# Patient Record
Sex: Male | Born: 1955
Health system: Southern US, Community
[De-identification: ages and names within clinical notes are randomized; demographics above are authoritative.]

## PROBLEM LIST (undated history)

## (undated) DIAGNOSIS — J449 Chronic obstructive pulmonary disease, unspecified: Secondary | ICD-10-CM

## (undated) DIAGNOSIS — I10 Essential (primary) hypertension: Secondary | ICD-10-CM

## (undated) DIAGNOSIS — I700 Atherosclerosis of aorta: Secondary | ICD-10-CM

## (undated) DIAGNOSIS — R0600 Dyspnea, unspecified: Secondary | ICD-10-CM

## (undated) HISTORY — PX: APPENDECTOMY: SHX54

---

## 2019-11-24 ENCOUNTER — Ambulatory Visit (INDEPENDENT_AMBULATORY_CARE_PROVIDER_SITE_OTHER): Payer: 59

## 2019-11-24 ENCOUNTER — Other Ambulatory Visit: Payer: Self-pay

## 2019-11-24 ENCOUNTER — Ambulatory Visit (INDEPENDENT_AMBULATORY_CARE_PROVIDER_SITE_OTHER): Payer: Self-pay

## 2019-11-24 ENCOUNTER — Ambulatory Visit
Admission: EM | Admit: 2019-11-24 | Discharge: 2019-11-24 | Disposition: A | Discharge status: Home / Self Care | Payer: 59 | Attending: Internal Medicine | Admitting: Internal Medicine

## 2019-11-24 DIAGNOSIS — J441 Chronic obstructive pulmonary disease with (acute) exacerbation: Secondary | ICD-10-CM

## 2019-11-24 DIAGNOSIS — C3490 Malignant neoplasm of unspecified part of unspecified bronchus or lung: Secondary | ICD-10-CM

## 2019-11-24 HISTORY — DX: Essential (primary) hypertension: I10

## 2019-11-24 LAB — BASIC METABOLIC PANEL
Anion gap: 9 (ref 5–15)
BUN: 21 mg/dL (ref 8–23)
CO2: 29 mmol/L (ref 22–32)
Calcium: 9.1 mg/dL (ref 8.9–10.3)
Chloride: 98 mmol/L (ref 98–111)
Creatinine, Ser: 0.9 mg/dL (ref 0.61–1.24)
GFR calc Af Amer: >60 mL/min (ref >60)
GFR calc non Af Amer: >60 mL/min (ref >60)
Glucose, Bld: 119 mg/dL — ABNORMAL HIGH (ref 70–99)
Potassium: 4.5 mmol/L (ref 3.5–5.1)
Sodium: 136 mmol/L (ref 135–145)

## 2019-11-24 LAB — CBC WITH DIFFERENTIAL/PLATELET
Abs Immature Granulocytes: 0.04 10*3/uL (ref 0.00–0.07)
Basophils Absolute: 0.1 10*3/uL (ref 0.0–0.1)
Basophils Relative: 1 %
Eosinophils Absolute: 0 10*3/uL (ref 0.0–0.5)
Eosinophils Relative: 0 %
HCT: 49.3 % (ref 39.0–52.0)
Hemoglobin: 16.2 g/dL (ref 13.0–17.0)
Immature Granulocytes: 1 %
Lymphocytes Relative: 10 %
Lymphs Abs: 0.8 10*3/uL (ref 0.7–4.0)
MCH: 29.1 pg (ref 26.0–34.0)
MCHC: 32.9 g/dL (ref 30.0–36.0)
MCV: 88.5 fL (ref 80.0–100.0)
Monocytes Absolute: 0.6 10*3/uL (ref 0.1–1.0)
Monocytes Relative: 7 %
Neutro Abs: 7.1 10*3/uL (ref 1.7–7.7)
Neutrophils Relative %: 81 %
Platelets: 283 10*3/uL (ref 150–400)
RBC: 5.57 MIL/uL (ref 4.22–5.81)
RDW: 13.6 % (ref 11.5–15.5)
WBC: 8.6 10*3/uL (ref 4.0–10.5)
nRBC: 0 % (ref 0.0–0.2)

## 2019-11-24 MED ORDER — GUAIFENESIN ER 600 MG PO TB12: 600.0000 mg | ORAL_TABLET | Freq: Two times a day (BID) | ORAL | 0 refills | Status: AC

## 2019-11-24 MED ORDER — PREDNISONE 20 MG PO TABS
40.0000 mg | ORAL_TABLET | Freq: Every day | ORAL | 0 refills | Status: AC
Start: 2019-11-24 — End: 2019-11-29

## 2019-11-24 MED ORDER — ALBUTEROL SULFATE HFA 108 (90 BASE) MCG/ACT IN AERS: 2.0000 | INHALATION_SPRAY | RESPIRATORY_TRACT | 0 refills | Status: AC | PRN

## 2019-11-24 MED ORDER — METHYLPREDNISOLONE SODIUM SUCC 125 MG IJ SOLR
60.0000 mg | Freq: Once | INTRAMUSCULAR | Status: AC
  Administered 2019-11-24: 60 mg via INTRAMUSCULAR

## 2019-11-24 MED ORDER — BENZONATATE 100 MG PO CAPS: 100.0000 mg | ORAL_CAPSULE | Freq: Three times a day (TID) | ORAL | 0 refills | Status: AC

## 2019-11-24 MED ORDER — AMLODIPINE BESYLATE 10 MG PO TABS: 10.0000 mg | ORAL_TABLET | Freq: Every day | ORAL | 1 refills | Status: AC

## 2019-11-24 MED ORDER — TRELEGY ELLIPTA 200-62.5-25 MCG/INH IN AEPB: 1.0000 | INHALATION_SPRAY | Freq: Every day | RESPIRATORY_TRACT | 1 refills | Status: AC

## 2019-11-24 NOTE — Discharge Instructions (Addendum)
Please take your medications as prescribed Patient is advised to go to the emergency department if shortness of breath worsens, he starts behaving in a confused manner or becomes altered in his mentation or if his symptoms does not improve.

## 2019-11-24 NOTE — ED Notes (Signed)
Patient has been approved for CPT 71250. Obtained via [employer]. Valid for 11/24/2019  Through 02/22/2020. Authorization # [id number].

## 2019-11-24 NOTE — ED Provider Notes (Addendum)
[facility]    CSN: [id number] Arrival date & time: 11/24/19  1357      History   Chief Complaint Chief Complaint  Patient presents with  . Shortness of Breath  . Cough    HPI Steven Morris is a 64 y.o. male comes to the urgent care with the 1 week history of worsening shortness of breath, cough productive of clear sputum and wheezing. Patient says symptoms have been persistent for about 10 days and has recently worsened. He denies any fever or chills. He admits to having some chest tightness. This morning shortness of breath was worse at rest and with exertion. No nausea or vomiting. No diarrhea. No dizziness, near syncope or syncopal episodes. Patient continues to smoke a pack of cigarettes a day and he is down that for 50 years.   HPI  Past Medical History:  Diagnosis Date  . Hypertension     There are no problems to display for this patient.   History reviewed. No pertinent surgical history.     Home Medications    Prior to Admission medications   Medication Sig Start Date End Date Taking? Authorizing Provider  aspirin 81 MG chewable tablet Chew by mouth daily.   Yes [provider]    Family History History reviewed. No pertinent family history.  Social History Social History   Tobacco Use  . Smoking status: Current Every Day Smoker    Packs/day: 1.00    Types: Cigarettes  . Smokeless tobacco: Never Used  Substance Use Topics  . Alcohol use: Yes    Comment: rare  . Drug use: Never     Allergies   Levofloxacin   Review of Systems Review of Systems  Constitutional: Positive for activity change. Negative for chills, fatigue and fever.  HENT: Positive for congestion. Negative for sore throat.   Respiratory: Positive for cough, chest tightness, shortness of breath and wheezing.   Cardiovascular: Positive for chest pain.  Gastrointestinal: Negative.   Genitourinary: Negative.   Musculoskeletal: Negative.   Skin: Negative.     Neurological: Negative for dizziness, light-headedness and headaches.     Physical Exam Triage Vital Signs ED Triage Vitals  Enc Vitals Group     BP 11/24/19 1413 (!) 171/70     Pulse Rate 11/24/19 1413 (!) 102     Resp 11/24/19 1413 (!) 40     Temp 11/24/19 1413 99.3 F (37.4 C)     Temp Source 11/24/19 1413 Oral     SpO2 11/24/19 1413 90 %     Weight 11/24/19 1416 168 lb (76.2 kg)     Height 11/24/19 1416 5\' 7"  (1.702 m)     Head Circumference --      Peak Flow --      Pain Score 11/24/19 1415 2     Pain Loc --      Pain Edu? --      Excl. in [place]? --    No data found.  Updated Vital Signs BP (!) 171/70 (BP Location: Right Arm) Comment: stopped taking his b/p meds a few months ago  Pulse (!) 102   Temp 99.3 F (37.4 C) (Oral)   Resp (!) 40   Ht 5\' 7"  (1.702 m)   Wt 76.2 kg   SpO2 97%   BMI 26.31 kg/m   Visual Acuity Right Eye Distance:   Left Eye Distance:   Bilateral Distance:    Right Eye Near:   Left Eye Near:  Bilateral Near:     Physical Exam Vitals and nursing note reviewed.  Constitutional:      General: He is in acute distress.     Appearance: He is well-developed. He is ill-appearing.  Cardiovascular:     Rate and Rhythm: Regular rhythm. Tachycardia present.  Pulmonary:     Effort: Tachypnea and respiratory distress present.     Breath sounds: Examination of the right-middle field reveals decreased breath sounds. Examination of the left-middle field reveals decreased breath sounds. Examination of the right-lower field reveals decreased breath sounds and wheezing. Examination of the left-lower field reveals decreased breath sounds and wheezing. Decreased breath sounds and wheezing present. No rhonchi or rales.  Chest:     Chest wall: No mass, deformity or crepitus.  Abdominal:     Palpations: Abdomen is soft. There is no hepatomegaly or splenomegaly.  Skin:    General: Skin is warm.  Neurological:     Mental Status: He is alert.      UC  Treatments / Results  Labs (all labs ordered are listed, but only abnormal results are displayed) Labs Reviewed  BASIC METABOLIC PANEL  CBC WITH DIFFERENTIAL/PLATELET    EKG   Radiology No results found.  Procedures Procedures (including critical care time)  Medications Ordered in UC Medications  methylPREDNISolone sodium succinate (SOLU-MEDROL) 125 mg/2 mL injection 60 mg (has no administration in time range)    Initial Impression / Assessment and Plan / UC Course  I have reviewed the triage vital signs and the nursing notes.  Pertinent labs & imaging results that were available during my care of the patient were reviewed by me and considered in my medical decision making (see chart for details).     1. Acute COPD with moderately severe exacerbation: Chest x-ray is significant for lung infiltrate with some concern for lung mass Solu-Medrol 60 mg IM x1 Prednisone 40 mg orally daily x5 days Doxycycline 100 mg twice daily x5 days Patient is advised to be compliant with this bronchodilator regimen of as needed albuterol and Trelegy inhaler. Return to urgent care if symptoms worsen.  2.  Newly diagnosed metastatic lung cancer: CT scan of the chest is significant for lung mass with mediastinal lymphadenopathy Referral has been sent to [facility] in [place] Patient to follow-up with the pulmonologist for further work-up and management. CBC, BMP done. Return precautions given.  Total time spent on this encounter is 73 minutes.  Over 50% of the time was used in direct patient care management and coordination of care of the patient.  Several attempts to reach out to the Pulmonologist on-call (Dr.Aleskerove through pager # [phone]). I didn't get a call back from provider on-call. I wanted to arrange follow up for the patient before discharging the patient from the urgent care. The patient wanted to go home and hence went home before follow up could be arranged. I  gave the patient information for Pulmonogist's practice. I also sent a referral to [facility] in [place], [place] as a back up in case the patient couldn't reach Dr.Aleskerove's office. Final Clinical Impressions(s) / UC Diagnoses   Final diagnoses:  None   Discharge Instructions   None    ED Prescriptions    None     PDMP not reviewed this encounter.   Chase Picket, MD 11/24/19 Randol Kern    Chase Picket, MD 11/24/19 2035    Chase Picket, MD 11/25/19 1118

## 2019-11-24 NOTE — ED Triage Notes (Signed)
Pt with cough productive of clear phlegm, congestion, shortness of breath and tachypnea.

## 2019-11-30 ENCOUNTER — Other Ambulatory Visit: Payer: Self-pay | Admitting: Specialist

## 2019-11-30 DIAGNOSIS — R911 Solitary pulmonary nodule: Secondary | ICD-10-CM

## 2019-12-02 ENCOUNTER — Other Ambulatory Visit: Payer: Self-pay | Admitting: Pulmonary Disease

## 2019-12-02 DIAGNOSIS — R911 Solitary pulmonary nodule: Secondary | ICD-10-CM

## 2019-12-07 ENCOUNTER — Other Ambulatory Visit: Admission: RE | Admit: 2019-12-07 | Payer: 59 | Source: Ambulatory Visit

## 2019-12-08 ENCOUNTER — Other Ambulatory Visit: Payer: 59

## 2019-12-08 ENCOUNTER — Ambulatory Visit: Payer: Self-pay

## 2019-12-08 ENCOUNTER — Ambulatory Visit: Payer: 59

## 2019-12-08 ENCOUNTER — Ambulatory Visit: Payer: Self-pay | Admitting: Nurse Practitioner

## 2019-12-09 ENCOUNTER — Ambulatory Visit: Payer: 59

## 2019-12-12 ENCOUNTER — Encounter
Admission: RE | Admit: 2019-12-12 | Discharge: 2019-12-12 | Disposition: A | Discharge status: Home / Self Care | Payer: 59 | Source: Ambulatory Visit | Attending: Specialist | Admitting: Specialist

## 2019-12-12 ENCOUNTER — Other Ambulatory Visit: Payer: Self-pay

## 2019-12-12 DIAGNOSIS — R911 Solitary pulmonary nodule: Secondary | ICD-10-CM | POA: Diagnosis not present

## 2019-12-12 LAB — GLUCOSE, CAPILLARY: Glucose-Capillary: 94 mg/dL (ref 70–99)

## 2019-12-12 MED ORDER — FLUDEOXYGLUCOSE F - 18 (FDG) INJECTION
8.0000 | Freq: Once | INTRAVENOUS | Status: AC | PRN
  Administered 2019-12-12: 8.591 via INTRAVENOUS

## 2019-12-13 ENCOUNTER — Encounter
Admission: RE | Admit: 2019-12-13 | Discharge: 2019-12-13 | Disposition: A | Discharge status: Home / Self Care | Payer: 59 | Source: Ambulatory Visit | Attending: Pulmonary Disease | Admitting: Pulmonary Disease

## 2019-12-13 HISTORY — DX: Chronic obstructive pulmonary disease, unspecified: J44.9

## 2019-12-13 HISTORY — DX: Dyspnea, unspecified: R06.00

## 2019-12-13 NOTE — Patient Instructions (Signed)
Your procedure is scheduled on: Friday December 23, 2019. Report to Day Surgery inside [facility] [place] 2nd floor. To find out your arrival time please call [phone] between 1PM - 3PM on Thursday December 22, 2019.  Remember: Instructions that are not followed completely may result in serious medical risk,  up to and including death, or upon the discretion of your surgeon and anesthesiologist your  surgery may need to be rescheduled.     _X__ 1. Do not eat food after midnight the night before your procedure.                 No gum chewing or hard candies. You may drink clear liquids up to 2 hours                 before you are scheduled to arrive for your surgery- DO not drink clear                 liquids within 2 hours of the start of your surgery.                 Clear Liquids include:  water, apple juice without pulp, clear Gatorade, G2 or                  Gatorade Zero (avoid Red/Purple/Blue), Black Coffee or Tea (Do not add                 anything to coffee or tea).  __X__2.  On the morning of surgery brush your teeth with toothpaste and water, you                may rinse your mouth with mouthwash if you wish.  Do not swallow any toothpaste of mouthwash.     _X__ 3.  No Alcohol for 24 hours before or after surgery.   _X__ 4.  Do Not Smoke or use e-cigarettes For 24 Hours Prior to Your Surgery.                 Do not use any chewable tobacco products for at least 6 hours prior to                 Surgery.  _X__  5.  Do not use any recreational drugs (marijuana, cocaine, heroin, ecstacy, MDMA or other)                For at least one week prior to your surgery.  Combination of these drugs with anesthesia                May have life threatening results.  __X__  6.  Notify your doctor if there is any change in your medical condition      (cold, fever, infections).     Do not wear jewelry, make-up, hairpins, clips or nail polish. Do not wear lotions, powders, or  perfumes. You may wear deodorant. Do not shave 48 hours prior to surgery. Men may shave face and neck. Do not bring valuables to the hospital.    [facility] is not responsible for any belongings or valuables.  Contacts, dentures or bridgework may not be worn into surgery. Leave your suitcase in the car. After surgery it may be brought to your room. For patients admitted to the hospital, discharge time is determined by your treatment team.   Patients discharged the day of surgery will not be allowed to drive home.   Make arrangements for someone to  be with you for the first 24 hours of your Same Day Discharge.   __X__ Take these medicines the morning of surgery with A SIP OF WATER:    1. amLODipine (NORVASC) 10 MG   __X__ Use inhalers on the day of surgery  albuterol (VENTOLIN HFA) 108 (90 Base) MCG/ACT inhaler   Fluticasone-Umeclidin-Vilant (TRELEGY ELLIPTA) 200-62.5-25 MCG/INH AEPB  __X__ Stop aspirin on Thursday July 29  __X__ Stop Anti-inflammatories such as Ibuprofen, Aleve, Advil, naproxen and BC powders.   __X__ Stop supplements until after surgery.    __X__ Do not start any herbal supplements before your surgery.

## 2019-12-15 ENCOUNTER — Inpatient Hospital Stay: Admission: RE | Admit: 2019-12-15 | Payer: 59 | Source: Ambulatory Visit

## 2019-12-19 ENCOUNTER — Ambulatory Visit: Admission: RE | Admit: 2019-12-19 | Payer: 59 | Source: Ambulatory Visit

## 2019-12-21 ENCOUNTER — Other Ambulatory Visit: Payer: 59

## 2019-12-23 ENCOUNTER — Encounter: Payer: Self-pay | Source: Ambulatory Visit

## 2019-12-23 ENCOUNTER — Ambulatory Visit: Admit: 2019-12-23 | Payer: 59 | Source: Ambulatory Visit

## 2019-12-23 SURGERY — VIDEO BRONCHOSCOPY WITH ENDOBRONCHIAL NAVIGATION
Anesthesia: General

## 2020-01-11 ENCOUNTER — Other Ambulatory Visit: Payer: Self-pay | Admitting: Specialist

## 2020-01-11 DIAGNOSIS — R0600 Dyspnea, unspecified: Secondary | ICD-10-CM

## 2020-01-11 DIAGNOSIS — R0609 Other forms of dyspnea: Secondary | ICD-10-CM

## 2020-01-11 DIAGNOSIS — R911 Solitary pulmonary nodule: Secondary | ICD-10-CM

## 2020-01-11 DIAGNOSIS — R9389 Abnormal findings on diagnostic imaging of other specified body structures: Secondary | ICD-10-CM

## 2020-01-31 ENCOUNTER — Ambulatory Visit: Payer: 59

## 2020-03-06 ENCOUNTER — Ambulatory Visit
Admission: RE | Admit: 2020-03-06 | Discharge: 2020-03-06 | Disposition: A | Discharge status: Home / Self Care | Payer: Medicare HMO | Source: Ambulatory Visit | Attending: Specialist | Admitting: Specialist

## 2020-03-06 ENCOUNTER — Other Ambulatory Visit: Payer: Self-pay

## 2020-03-06 DIAGNOSIS — I700 Atherosclerosis of aorta: Secondary | ICD-10-CM | POA: Diagnosis not present

## 2020-03-06 DIAGNOSIS — R0609 Other forms of dyspnea: Secondary | ICD-10-CM

## 2020-03-06 DIAGNOSIS — R911 Solitary pulmonary nodule: Secondary | ICD-10-CM | POA: Insufficient documentation

## 2020-03-06 DIAGNOSIS — R0600 Dyspnea, unspecified: Secondary | ICD-10-CM | POA: Diagnosis not present

## 2020-03-06 DIAGNOSIS — I2510 Atherosclerotic heart disease of native coronary artery without angina pectoris: Secondary | ICD-10-CM | POA: Diagnosis not present

## 2020-03-06 DIAGNOSIS — J438 Other emphysema: Secondary | ICD-10-CM | POA: Diagnosis not present

## 2020-03-06 DIAGNOSIS — J432 Centrilobular emphysema: Secondary | ICD-10-CM | POA: Diagnosis not present

## 2020-03-06 DIAGNOSIS — R9389 Abnormal findings on diagnostic imaging of other specified body structures: Secondary | ICD-10-CM | POA: Diagnosis not present

## 2020-03-13 ENCOUNTER — Other Ambulatory Visit: Payer: Self-pay | Admitting: Specialist

## 2020-03-13 ENCOUNTER — Other Ambulatory Visit (HOSPITAL_COMMUNITY): Payer: Self-pay | Admitting: Specialist

## 2020-03-13 DIAGNOSIS — Z8679 Personal history of other diseases of the circulatory system: Secondary | ICD-10-CM | POA: Diagnosis not present

## 2020-03-13 DIAGNOSIS — J439 Emphysema, unspecified: Secondary | ICD-10-CM | POA: Diagnosis not present

## 2020-03-13 DIAGNOSIS — R0602 Shortness of breath: Secondary | ICD-10-CM

## 2020-03-13 DIAGNOSIS — Z23 Encounter for immunization: Secondary | ICD-10-CM | POA: Diagnosis not present

## 2020-03-13 DIAGNOSIS — N400 Enlarged prostate without lower urinary tract symptoms: Secondary | ICD-10-CM | POA: Diagnosis not present

## 2020-03-13 DIAGNOSIS — Z125 Encounter for screening for malignant neoplasm of prostate: Secondary | ICD-10-CM | POA: Diagnosis not present

## 2020-03-13 DIAGNOSIS — F17210 Nicotine dependence, cigarettes, uncomplicated: Secondary | ICD-10-CM | POA: Diagnosis not present

## 2020-03-13 DIAGNOSIS — R918 Other nonspecific abnormal finding of lung field: Secondary | ICD-10-CM

## 2020-05-31 DIAGNOSIS — J441 Chronic obstructive pulmonary disease with (acute) exacerbation: Secondary | ICD-10-CM | POA: Diagnosis not present

## 2020-05-31 DIAGNOSIS — R059 Cough, unspecified: Secondary | ICD-10-CM | POA: Diagnosis not present

## 2020-05-31 DIAGNOSIS — J449 Chronic obstructive pulmonary disease, unspecified: Secondary | ICD-10-CM | POA: Diagnosis not present

## 2020-05-31 DIAGNOSIS — R053 Chronic cough: Secondary | ICD-10-CM | POA: Diagnosis not present

## 2020-08-28 ENCOUNTER — Ambulatory Visit
Admission: RE | Admit: 2020-08-28 | Discharge: 2020-08-28 | Disposition: A | Discharge status: Home / Self Care | Payer: Medicare HMO | Source: Ambulatory Visit | Attending: Specialist | Admitting: Specialist

## 2020-08-28 ENCOUNTER — Other Ambulatory Visit: Payer: Self-pay

## 2020-08-28 DIAGNOSIS — R0602 Shortness of breath: Secondary | ICD-10-CM | POA: Insufficient documentation

## 2020-08-28 DIAGNOSIS — I708 Atherosclerosis of other arteries: Secondary | ICD-10-CM | POA: Diagnosis not present

## 2020-08-28 DIAGNOSIS — J432 Centrilobular emphysema: Secondary | ICD-10-CM | POA: Diagnosis not present

## 2020-08-28 DIAGNOSIS — R918 Other nonspecific abnormal finding of lung field: Secondary | ICD-10-CM | POA: Insufficient documentation

## 2020-08-28 DIAGNOSIS — I700 Atherosclerosis of aorta: Secondary | ICD-10-CM | POA: Diagnosis not present

## 2020-08-28 DIAGNOSIS — I2510 Atherosclerotic heart disease of native coronary artery without angina pectoris: Secondary | ICD-10-CM | POA: Diagnosis not present

## 2020-08-30 ENCOUNTER — Ambulatory Visit: Payer: Medicare HMO

## 2020-09-04 ENCOUNTER — Other Ambulatory Visit: Payer: Self-pay | Admitting: Specialist

## 2020-09-04 DIAGNOSIS — R918 Other nonspecific abnormal finding of lung field: Secondary | ICD-10-CM | POA: Diagnosis not present

## 2020-09-04 DIAGNOSIS — J449 Chronic obstructive pulmonary disease, unspecified: Secondary | ICD-10-CM | POA: Diagnosis not present

## 2020-09-04 DIAGNOSIS — R0600 Dyspnea, unspecified: Secondary | ICD-10-CM | POA: Diagnosis not present

## 2020-09-18 DIAGNOSIS — J438 Other emphysema: Secondary | ICD-10-CM | POA: Diagnosis not present

## 2020-09-18 DIAGNOSIS — Z1159 Encounter for screening for other viral diseases: Secondary | ICD-10-CM | POA: Diagnosis not present

## 2020-09-18 DIAGNOSIS — Z0000 Encounter for general adult medical examination without abnormal findings: Secondary | ICD-10-CM | POA: Diagnosis not present

## 2020-09-18 DIAGNOSIS — I2510 Atherosclerotic heart disease of native coronary artery without angina pectoris: Secondary | ICD-10-CM | POA: Diagnosis not present

## 2020-09-18 DIAGNOSIS — I700 Atherosclerosis of aorta: Secondary | ICD-10-CM | POA: Diagnosis not present

## 2020-09-18 DIAGNOSIS — R739 Hyperglycemia, unspecified: Secondary | ICD-10-CM | POA: Diagnosis not present

## 2020-09-18 DIAGNOSIS — Z114 Encounter for screening for human immunodeficiency virus [HIV]: Secondary | ICD-10-CM | POA: Diagnosis not present

## 2020-09-18 DIAGNOSIS — D649 Anemia, unspecified: Secondary | ICD-10-CM | POA: Diagnosis not present

## 2020-09-18 DIAGNOSIS — Z23 Encounter for immunization: Secondary | ICD-10-CM | POA: Diagnosis not present

## 2020-09-18 DIAGNOSIS — E785 Hyperlipidemia, unspecified: Secondary | ICD-10-CM | POA: Diagnosis not present

## 2020-09-18 HISTORY — DX: Atherosclerotic heart disease of native coronary artery without angina pectoris: I25.10

## 2020-09-25 DIAGNOSIS — Z1211 Encounter for screening for malignant neoplasm of colon: Secondary | ICD-10-CM | POA: Diagnosis not present

## 2020-12-27 DIAGNOSIS — R739 Hyperglycemia, unspecified: Secondary | ICD-10-CM | POA: Diagnosis not present

## 2020-12-27 DIAGNOSIS — F17210 Nicotine dependence, cigarettes, uncomplicated: Secondary | ICD-10-CM | POA: Diagnosis not present

## 2020-12-27 DIAGNOSIS — I700 Atherosclerosis of aorta: Secondary | ICD-10-CM | POA: Diagnosis not present

## 2020-12-27 DIAGNOSIS — J438 Other emphysema: Secondary | ICD-10-CM | POA: Diagnosis not present

## 2020-12-27 DIAGNOSIS — R195 Other fecal abnormalities: Secondary | ICD-10-CM | POA: Diagnosis not present

## 2021-02-27 ENCOUNTER — Other Ambulatory Visit: Payer: Self-pay | Admitting: Specialist

## 2021-02-27 ENCOUNTER — Ambulatory Visit: Payer: Medicare HMO

## 2021-02-27 DIAGNOSIS — R918 Other nonspecific abnormal finding of lung field: Secondary | ICD-10-CM

## 2021-03-12 DIAGNOSIS — I2510 Atherosclerotic heart disease of native coronary artery without angina pectoris: Secondary | ICD-10-CM | POA: Diagnosis not present

## 2021-03-12 DIAGNOSIS — R739 Hyperglycemia, unspecified: Secondary | ICD-10-CM | POA: Diagnosis not present

## 2021-03-13 ENCOUNTER — Ambulatory Visit
Admission: RE | Admit: 2021-03-13 | Discharge: 2021-03-13 | Disposition: A | Discharge status: Home / Self Care | Payer: Medicare HMO | Source: Ambulatory Visit | Attending: Specialist | Admitting: Specialist

## 2021-03-13 ENCOUNTER — Other Ambulatory Visit: Payer: Self-pay

## 2021-03-13 DIAGNOSIS — R918 Other nonspecific abnormal finding of lung field: Secondary | ICD-10-CM | POA: Insufficient documentation

## 2021-03-13 DIAGNOSIS — I700 Atherosclerosis of aorta: Secondary | ICD-10-CM | POA: Diagnosis not present

## 2021-03-13 DIAGNOSIS — J439 Emphysema, unspecified: Secondary | ICD-10-CM | POA: Diagnosis not present

## 2021-03-13 DIAGNOSIS — R911 Solitary pulmonary nodule: Secondary | ICD-10-CM | POA: Diagnosis not present

## 2021-03-19 ENCOUNTER — Other Ambulatory Visit (HOSPITAL_BASED_OUTPATIENT_CLINIC_OR_DEPARTMENT_OTHER): Payer: Self-pay | Admitting: Specialist

## 2021-03-19 ENCOUNTER — Other Ambulatory Visit: Payer: Self-pay | Admitting: Specialist

## 2021-03-19 DIAGNOSIS — F17210 Nicotine dependence, cigarettes, uncomplicated: Secondary | ICD-10-CM | POA: Diagnosis not present

## 2021-03-19 DIAGNOSIS — I10 Essential (primary) hypertension: Secondary | ICD-10-CM | POA: Diagnosis not present

## 2021-03-19 DIAGNOSIS — I2510 Atherosclerotic heart disease of native coronary artery without angina pectoris: Secondary | ICD-10-CM | POA: Diagnosis not present

## 2021-03-19 DIAGNOSIS — R739 Hyperglycemia, unspecified: Secondary | ICD-10-CM | POA: Diagnosis not present

## 2021-03-19 DIAGNOSIS — J449 Chronic obstructive pulmonary disease, unspecified: Secondary | ICD-10-CM | POA: Diagnosis not present

## 2021-03-19 DIAGNOSIS — I700 Atherosclerosis of aorta: Secondary | ICD-10-CM | POA: Diagnosis not present

## 2021-03-19 DIAGNOSIS — F17200 Nicotine dependence, unspecified, uncomplicated: Secondary | ICD-10-CM | POA: Diagnosis not present

## 2021-03-19 DIAGNOSIS — R918 Other nonspecific abnormal finding of lung field: Secondary | ICD-10-CM | POA: Diagnosis not present

## 2021-03-19 DIAGNOSIS — Z125 Encounter for screening for malignant neoplasm of prostate: Secondary | ICD-10-CM | POA: Diagnosis not present

## 2021-03-19 DIAGNOSIS — R195 Other fecal abnormalities: Secondary | ICD-10-CM | POA: Diagnosis not present

## 2021-03-19 DIAGNOSIS — J439 Emphysema, unspecified: Secondary | ICD-10-CM | POA: Diagnosis not present

## 2021-03-19 DIAGNOSIS — R0609 Other forms of dyspnea: Secondary | ICD-10-CM | POA: Diagnosis not present

## 2021-03-19 IMAGING — CT CT CHEST W/O CM
2 of 3 series · 15 of 36 positions shown, 18 images · non-contrast
Comparison: Multiple exams, including 12/12/2019

CLINICAL DATA: Waxing and waning nodules, including a new nodule in
the lingula on prior PET-CT of 12/12/2019, for further
characterization.

EXAM:
CT CHEST WITHOUT CONTRAST
TECHNIQUE: Multidetector CT imaging of the chest was performed following the
standard protocol without IV contrast.

[Series 2: chest 2.00 · axial · 0.65mm/px · z∈[-1168,-886]mm · 12 of 167 slices shown, 15 images]
[im 13/167  mediastinal]
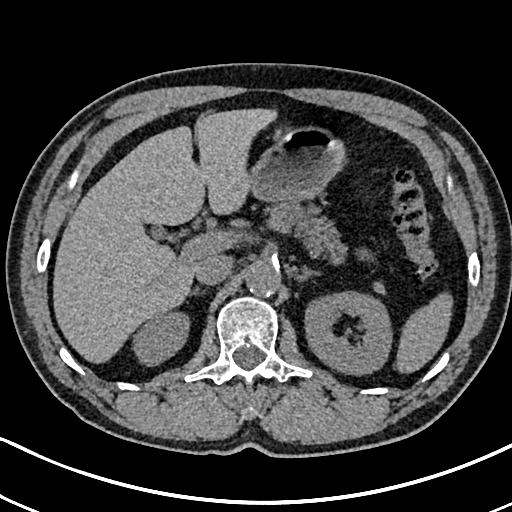
[im 13/167  lung]
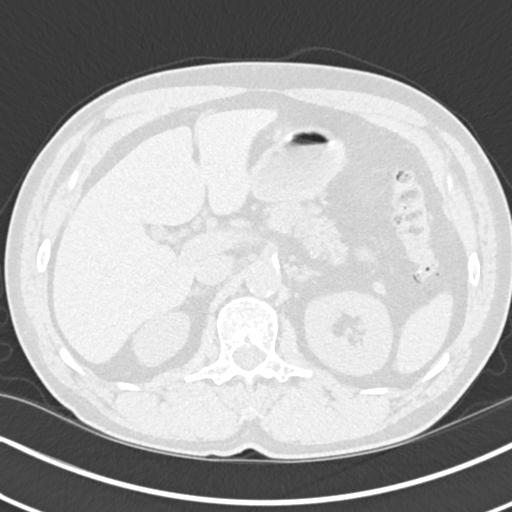
[im 26/167  lung]
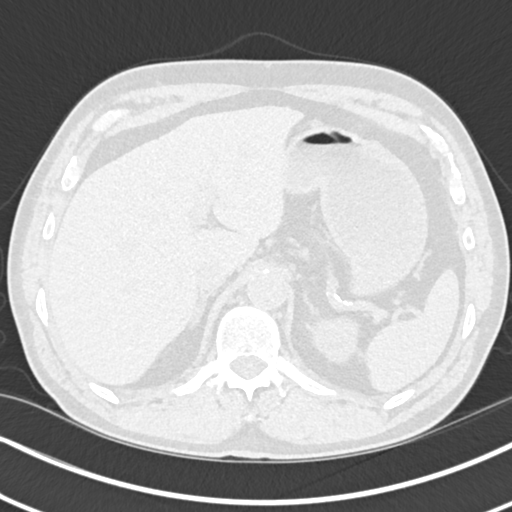
[im 39/167  lung]
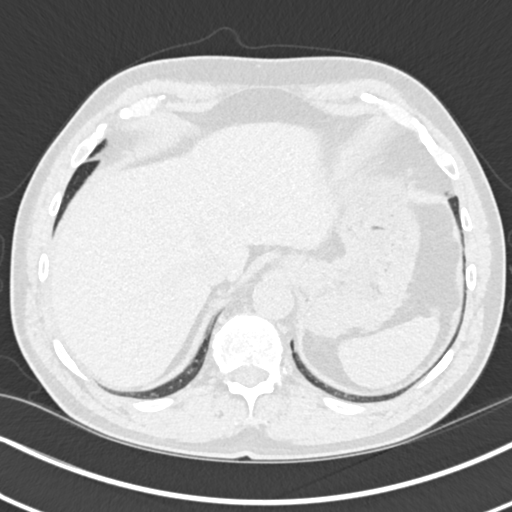
[im 52/167  lung]
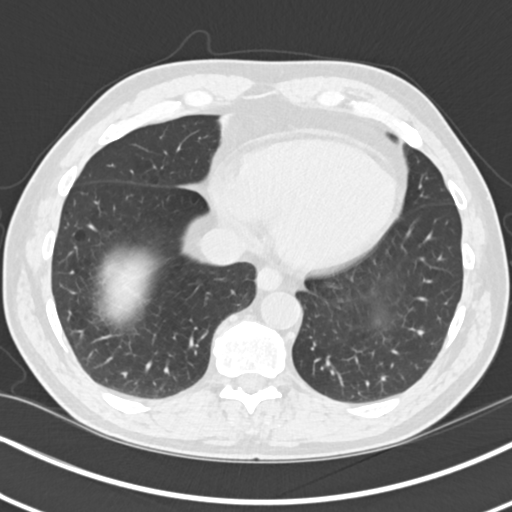
[im 64/167  mediastinal]
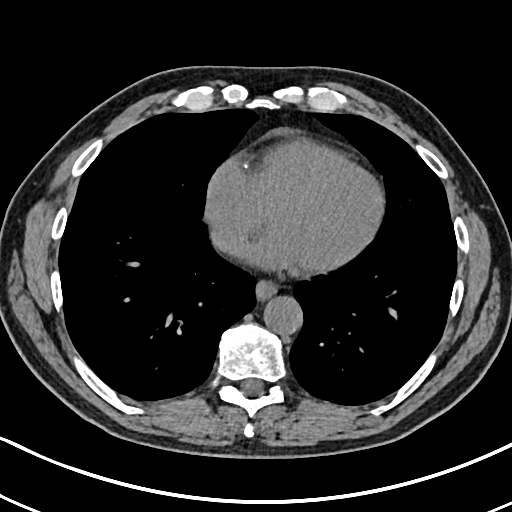
[im 64/167  lung]
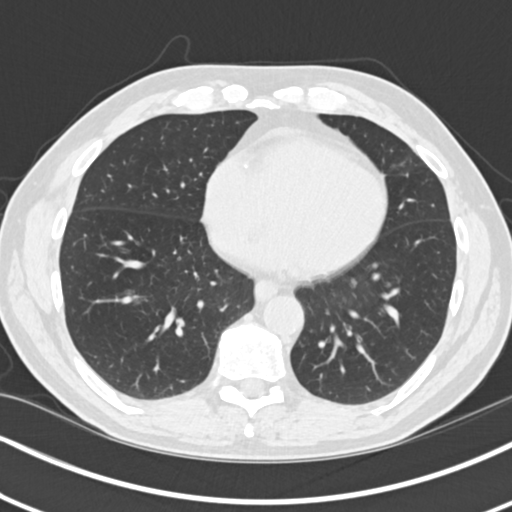
[im 77/167  lung]
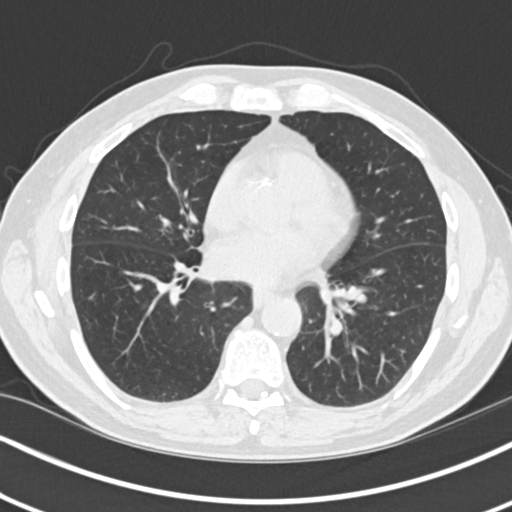
[im 90/167  lung]
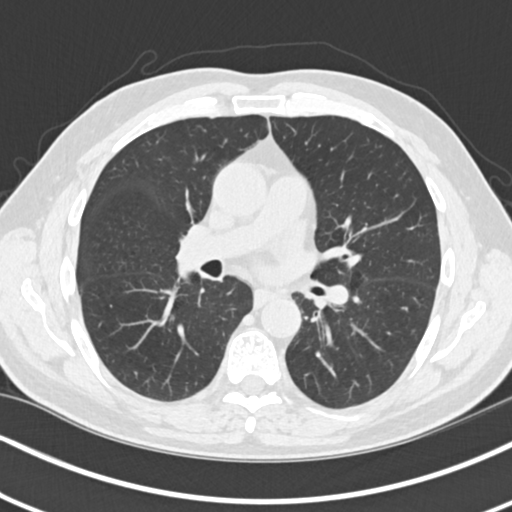
[im 103/167  lung]
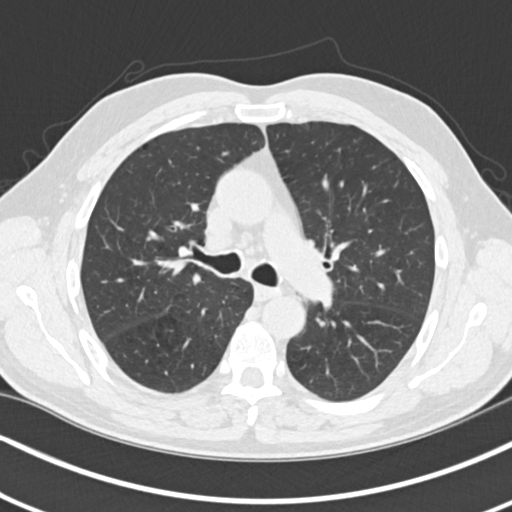
[im 115/167  mediastinal]
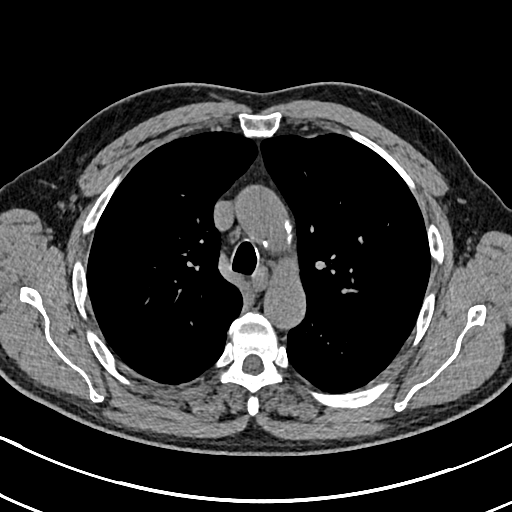
[im 115/167  lung]
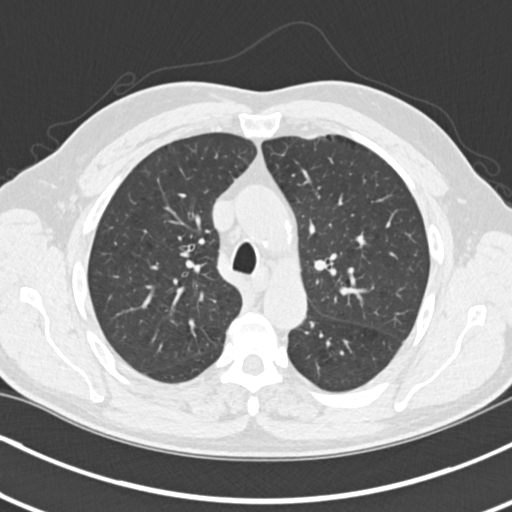
[im 128/167  lung]
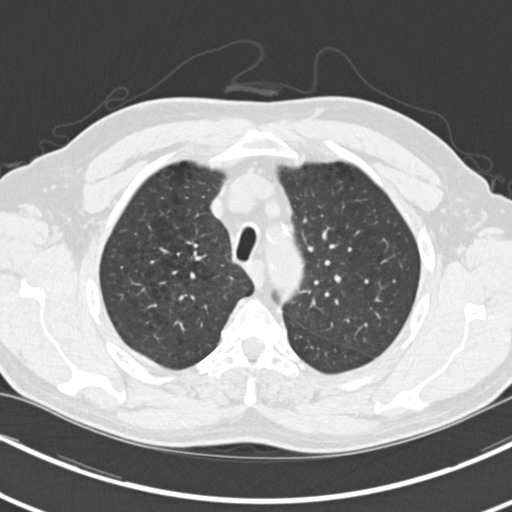
[im 141/167  lung]
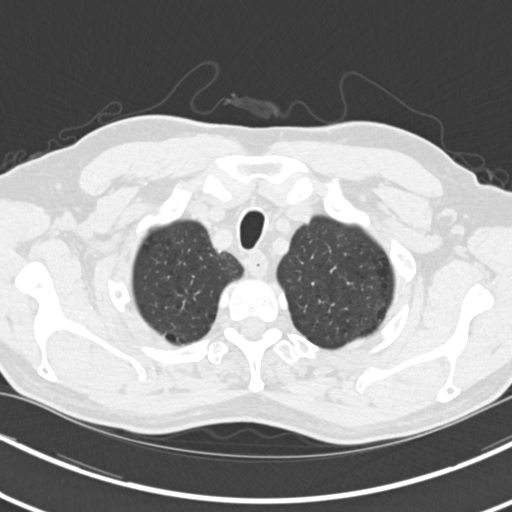
[im 154/167  lung]
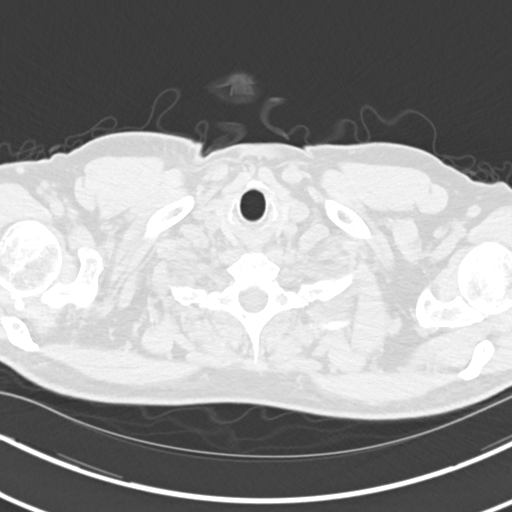

[Series 5: coronals chest 2.00 cor · coronal · 0.65mm/px · 3 of 156 slices shown]
[im 32/156  lung]
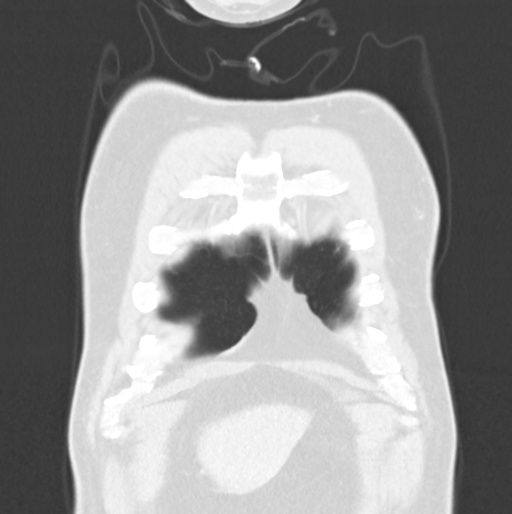
[im 63/156  lung]
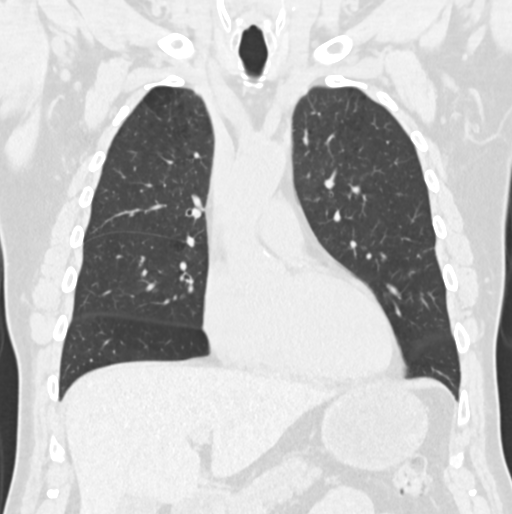
[im 94/156  lung]
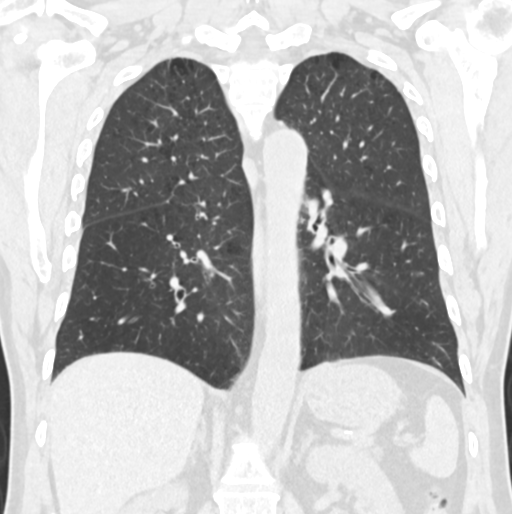

[15 of 36 positions shown; findings below may reference images not displayed]

FINDINGS: Cardiovascular: Coronary, aortic arch, and branch vessel
atherosclerotic vascular disease.

Mediastinum/Nodes: Right paratracheal node 0.7 cm in short axis on
image 55 series 2, previously 0.6 cm. Other mediastinal lymph nodes
are within normal size limits as well. No overt pathologic
adenopathy is identified.

Lungs/Pleura: Centrilobular emphysema. Airway thickening is present,
suggesting bronchitis or reactive airways disease. Mild airway
plugging in the right lower lobe on image 104 series 3. Mild airway
plugging in the left lower lobe.

The previous left upper lobe nodule shown on chest CT is again
observed, with somewhat spiculated margins, measuring 1.0 by 0.8 cm
on image 81 of series 3. This previously measured 1.5 by 1.4 cm on
the chest CT from 11/24/2019 and has substantially reduced in size
since that time. There is a component of volume loss as well, with
converging vessels in this vicinity. On PET-CT this had a maximum
SUV of only 0.67, and accordingly is probably benign given the
reduction in size and low level of activity.

A bandlike nodule in the lingula described on prior PET-CT has
intervally resolved, indicating that it was inflammatory.

No significant new nodules are identified.

Upper Abdomen: Abdominal aortic atherosclerosis.

Musculoskeletal: Thoracic spondylosis.
IMPRESSION: 1. The bandlike lingular nodule of concern on prior PET-CT has
completely resolved, indicating that it was inflammatory and
transient.
2. The spiculated left upper lobe pulmonary nodule is roughly
similar to the prior PET-CT. This had substantially reduced in size
from the previous chest CT of 11/24/2019, and was not
hypermetabolic, accordingly probably benign and inflammatory. Given
the morphology of this nodule, as well as the overall reassuring
natural history and PET-CT appearance of this nodule, I would
suggest a follow up chest CT in 6-12 months time to exclude the
highly unlikely possibility of low-grade adenocarcinoma.
3. Coronary, aortic arch, and branch vessel atherosclerotic vascular
disease.
4. Airway thickening is present, suggesting bronchitis or reactive
airways disease. Mild airway plugging in the lower lobes.
5. Emphysema and aortic atherosclerosis.

Aortic Atherosclerosis (WHU88-FJT.T) and Emphysema (WHU88-NFM.F).

## 2021-04-09 DIAGNOSIS — E785 Hyperlipidemia, unspecified: Secondary | ICD-10-CM | POA: Diagnosis not present

## 2021-04-16 ENCOUNTER — Encounter: Payer: Self-pay | Admitting: Internal Medicine

## 2021-04-17 ENCOUNTER — Encounter: Payer: Self-pay | Admitting: Internal Medicine

## 2021-04-17 ENCOUNTER — Encounter
Admission: RE | Disposition: A | Discharge status: Home / Self Care | Payer: Self-pay | Source: Home / Self Care | Attending: Internal Medicine

## 2021-04-17 ENCOUNTER — Ambulatory Visit
Admission: RE | Admit: 2021-04-17 | Discharge: 2021-04-17 | Disposition: A | Discharge status: Home / Self Care | Payer: Medicare HMO | Attending: Internal Medicine | Admitting: Internal Medicine

## 2021-04-17 ENCOUNTER — Ambulatory Visit: Payer: Medicare HMO | Admitting: Anesthesiology

## 2021-04-17 ENCOUNTER — Other Ambulatory Visit: Payer: Self-pay

## 2021-04-17 DIAGNOSIS — K635 Polyp of colon: Secondary | ICD-10-CM | POA: Diagnosis not present

## 2021-04-17 DIAGNOSIS — K5790 Diverticulosis of intestine, part unspecified, without perforation or abscess without bleeding: Secondary | ICD-10-CM | POA: Diagnosis not present

## 2021-04-17 DIAGNOSIS — J449 Chronic obstructive pulmonary disease, unspecified: Secondary | ICD-10-CM | POA: Diagnosis not present

## 2021-04-17 DIAGNOSIS — F17200 Nicotine dependence, unspecified, uncomplicated: Secondary | ICD-10-CM | POA: Diagnosis not present

## 2021-04-17 DIAGNOSIS — D125 Benign neoplasm of sigmoid colon: Secondary | ICD-10-CM | POA: Insufficient documentation

## 2021-04-17 DIAGNOSIS — K640 First degree hemorrhoids: Secondary | ICD-10-CM | POA: Insufficient documentation

## 2021-04-17 DIAGNOSIS — R195 Other fecal abnormalities: Secondary | ICD-10-CM | POA: Diagnosis not present

## 2021-04-17 DIAGNOSIS — I10 Essential (primary) hypertension: Secondary | ICD-10-CM | POA: Insufficient documentation

## 2021-04-17 DIAGNOSIS — K5730 Diverticulosis of large intestine without perforation or abscess without bleeding: Secondary | ICD-10-CM | POA: Diagnosis not present

## 2021-04-17 DIAGNOSIS — I2510 Atherosclerotic heart disease of native coronary artery without angina pectoris: Secondary | ICD-10-CM | POA: Insufficient documentation

## 2021-04-17 HISTORY — PX: COLONOSCOPY WITH PROPOFOL: SHX5780

## 2021-04-17 HISTORY — DX: Atherosclerosis of aorta: I70.0

## 2021-04-17 SURGERY — COLONOSCOPY WITH PROPOFOL
Anesthesia: General

## 2021-04-17 MED ORDER — PROPOFOL 10 MG/ML IV BOLUS
INTRAVENOUS | Status: DC | PRN
  Administered 2021-04-17: 80 mg via INTRAVENOUS

## 2021-04-17 MED ORDER — LIDOCAINE HCL (CARDIAC) PF 100 MG/5ML IV SOSY
PREFILLED_SYRINGE | INTRAVENOUS | Status: DC | PRN
  Administered 2021-04-17: 60 mg via INTRAVENOUS

## 2021-04-17 MED ORDER — PROPOFOL 500 MG/50ML IV EMUL
INTRAVENOUS | Status: DC | PRN
  Administered 2021-04-17: 150 ug/kg/min via INTRAVENOUS

## 2021-04-17 MED ORDER — SODIUM CHLORIDE 0.9 % IV SOLN: INTRAVENOUS | Status: DC

## 2021-04-17 NOTE — H&P (Signed)
Outpatient short stay form Pre-procedure 04/17/2021 1:36 PM Arlis Everly K. Alice Reichert, M.D.  Primary Physician: Ramonita Lab III, M.D.  Reason for visit:  Positive fecal dna (Cologuard test).  History of present illness:  Patient is a pleasant 65 y/o male/male presenting on referral from primary care provider for a POSITIVE Cologuard result. Patient denies change in bowel habits, rectal bleeding, weight loss or abdominal pain.       Current Facility-Administered Medications:    0.9 %  sodium chloride infusion, , Intravenous, Continuous, Eliana Lueth, Benay Pike, MD  Medications Prior to Admission  Medication Sig Dispense Refill Last Dose   buPROPion (WELLBUTRIN XL) 150 MG 24 hr tablet Take 150 mg by mouth daily.      Pediatric Multivit-Minerals-C (MULTIVITAMIN CHILDRENS GUMMIES) CHEW Chew 200 mcg by mouth daily.      acetaminophen (TYLENOL) 500 MG tablet Take 500 mg by mouth every 6 (six) hours as needed.      albuterol (VENTOLIN HFA) 108 (90 Base) MCG/ACT inhaler Inhale 2 puffs into the lungs every 4 (four) hours as needed for wheezing or shortness of breath. (Patient taking differently: Inhale 2 puffs into the lungs in the morning and at bedtime. ) 18 g 0    amLODipine (NORVASC) 10 MG tablet Take 1 tablet (10 mg total) by mouth daily. 30 tablet 1    aspirin 81 MG chewable tablet Chew 81 mg by mouth daily.       benzonatate (TESSALON) 100 MG capsule Take 1 capsule (100 mg total) by mouth every 8 (eight) hours. 30 capsule 0    Cholecalciferol (VITAMIN D) 50 MCG (2000 UT) tablet Take 2,000 Units by mouth daily.      Fluticasone-Umeclidin-Vilant (TRELEGY ELLIPTA) 200-62.5-25 MCG/INH AEPB Inhale 1 puff into the lungs daily. 28 each 1    methylPREDNISolone (MEDROL DOSEPAK) 4 MG TBPK tablet Take 4-24 mg by mouth See admin instructions. (Typical regimens for 21 tablet dose packs of Methylprednisolone 4mg , Prednisone 5mg , and Prednisone 10mg ) Day 1: 2 tabs before breakfast, 1 tab after lunch, 1 tab after  supper, and 2 tabs at bedtime. Day 2: 1 tab before breakfast, 1 tab after lunch, 1 tab after supper, and 2 tabs at bedtime. Day 3: 1 tab before breakfast, 1 tab after lunch, 1 tab after supper, and 1 tab at bedtime. Day 4: 1 tab before breakfast, 1 tab after lunch, and 1 tab at bedtime. Day 5: 1 tab before breakfast and 1 tab at bedtime. Day 6: 1 tab before breakfast.      Phenylephrine-DM-GG-APAP (MUCINEX FAST-MAX SEVERE COLD) 5-10-200-325 MG/10ML LIQD Take 5 mLs by mouth at bedtime as needed (cough).      Pseudoephedrine-Guaifenesin (MUCINEX D MAX STRENGTH) [phone] MG TB12 Take 1 tablet by mouth in the morning and at bedtime.        Allergies  Allergen Reactions   Levofloxacin Itching     Past Medical History:  Diagnosis Date   Aortic atherosclerosis (HCC)    COPD (chronic obstructive pulmonary disease) (HCC)    Coronary artery calcification seen on CT scan 09/18/2020   Dyspnea    Hypertension     Review of systems:  Otherwise negative.    Physical Exam  Gen: Alert, oriented. Appears stated age.  HEENT: [place]/AT. PERRLA. Lungs: CTA, no wheezes. CV: RR nl S1, S2. Abd: soft, benign, no masses. BS+ Ext: No edema. Pulses 2+    Planned procedures: Proceed with colonoscopy. The patient understands the nature of the planned procedure, indications, risks, alternatives and  potential complications including but not limited to bleeding, infection, perforation, damage to internal organs and possible oversedation/side effects from anesthesia. The patient agrees and gives consent to proceed.  Please refer to procedure notes for findings, recommendations and patient disposition/instructions.     Kier Smead K. Alice Reichert, M.D. Gastroenterology 04/17/2021  1:36 PM

## 2021-04-17 NOTE — Op Note (Signed)
[facility] Patient Name: Steven Morris Procedure Date: 04/17/2021 2:03 PM MRN: [id number] Account #: [id number] Date of Birth: 03-Apr-1956 Admit Type: Outpatient Age: 65 Room: [facility] ENDO ROOM 2 Gender: Male Note Status: Finalized Instrument Name: Colonoscope [id number] Procedure:             Colonoscopy Indications:           Positive Cologuard test Providers:             Benay Pike. Alice Reichert MD, MD Referring MD:          Ramonita Lab, MD (Referring MD) Medicines:             Propofol per Anesthesia Complications:         No immediate complications. Procedure:             Pre-Anesthesia Assessment:                        - The risks and benefits of the procedure and the                         sedation options and risks were discussed with the                         patient. All questions were answered and informed                         consent was obtained.                        - Patient identification and proposed procedure were                         verified prior to the procedure by the nurse. The                         procedure was verified in the procedure room.                        - ASA Grade Assessment: III - A patient with severe                         systemic disease.                        - After reviewing the risks and benefits, the patient                         was deemed in satisfactory condition to undergo the                         procedure.                        After obtaining informed consent, the colonoscope was                         passed under direct vision. Throughout the procedure,                         the patient's blood  pressure, pulse, and oxygen                         saturations were monitored continuously. The                         Colonoscope was introduced through the anus and                         advanced to the the cecum, identified by appendiceal                         orifice and  ileocecal valve. The colonoscopy was                         performed without difficulty. The patient tolerated                         the procedure well. The quality of the bowel                         preparation was good. The ileocecal valve, appendiceal                         orifice, and rectum were photographed. Findings:      The perianal and digital rectal examinations were normal. Pertinent       negatives include normal sphincter tone and no palpable rectal lesions.      Non-bleeding internal hemorrhoids were found during retroflexion. The       hemorrhoids were Grade I (internal hemorrhoids that do not prolapse).      Multiple small and large-mouthed diverticula were found in the sigmoid       colon. There was no evidence of diverticular bleeding.      A 4 mm polyp was found in the sigmoid colon. The polyp was sessile. The       polyp was removed with a jumbo cold forceps. Resection and retrieval       were complete.      The exam was otherwise without abnormality. Impression:            - Non-bleeding internal hemorrhoids.                        - Mild diverticulosis in the sigmoid colon. There was                         no evidence of diverticular bleeding.                        - One 4 mm polyp in the sigmoid colon, removed with a                         jumbo cold forceps. Resected and retrieved.                        - The examination was otherwise normal. Recommendation:        - Patient has a contact number available for  emergencies. The signs and symptoms of potential                         delayed complications were discussed with the patient.                         Return to normal activities tomorrow. Written                         discharge instructions were provided to the patient.                        - Resume previous diet.                        - Continue present medications.                        - Repeat colonoscopy is  recommended for surveillance.                         The colonoscopy date will be determined after                         pathology results from today's exam become available                         for review.                        - Return to GI office PRN.                        - The findings and recommendations were discussed with                         the patient. Procedure Code(s):     --- Professional ---                        [phone], Colonoscopy, flexible; with biopsy, single or                         multiple Diagnosis Code(s):     --- Professional ---                        K57.30, Diverticulosis of large intestine without                         perforation or abscess without bleeding                        R19.5, Other fecal abnormalities                        K63.5, Polyp of colon                        K64.0, First degree hemorrhoids CPT copyright 2019 [employer]. All rights reserved. The codes documented in this report are preliminary and upon coder review may  be revised to meet current compliance requirements.  Efrain Sella MD, MD 04/17/2021 2:29:35 PM This report has been signed electronically. Number of Addenda: 0 Note Initiated On: 04/17/2021 2:03 PM Scope Withdrawal Time: 0 hours 6 minutes 17 seconds  Total Procedure Duration: 0 hours 9 minutes 31 seconds  Estimated Blood Loss:  Estimated blood loss: none.      [facility]

## 2021-04-17 NOTE — Anesthesia Preprocedure Evaluation (Signed)
Anesthesia Evaluation  Patient identified by MRN, date of birth, ID band Patient awake    Reviewed: Allergy & Precautions, NPO status , Patient's Chart, lab work & pertinent test results  Airway Mallampati: II  TM Distance: >3 FB Neck ROM: full    Dental  (+) Teeth Intact   Pulmonary neg pulmonary ROS, shortness of breath, COPD, Current Smoker,    Pulmonary exam normal  + decreased breath sounds      Cardiovascular hypertension, + CAD  negative cardio ROS Normal cardiovascular exam Rhythm:Irregular     Neuro/Psych negative neurological ROS  negative psych ROS   GI/Hepatic negative GI ROS, Neg liver ROS,   Endo/Other  negative endocrine ROS  Renal/GU negative Renal ROS  negative genitourinary   Musculoskeletal negative musculoskeletal ROS (+)   Abdominal Normal abdominal exam  (+)   Peds  Hematology negative hematology ROS (+)   Anesthesia Other Findings Past Medical History: No date: Aortic atherosclerosis (HCC) No date: COPD (chronic obstructive pulmonary disease) ([place]) 09/18/2020: Coronary artery calcification seen on CT scan No date: Dyspnea No date: Hypertension  Past Surgical History: No date: APPENDECTOMY  BMI    Body Mass Index: 26.31 kg/m      Reproductive/Obstetrics negative OB ROS                             Anesthesia Physical Anesthesia Plan  ASA: 3  Anesthesia Plan: General   Post-op Pain Management:    Induction: Intravenous  PONV Risk Score and Plan: Propofol infusion and TIVA  Airway Management Planned: Natural Airway and Nasal Cannula  Additional Equipment:   Intra-op Plan:   Post-operative Plan:   Informed Consent: I have reviewed the patients History and Physical, chart, labs and discussed the procedure including the risks, benefits and alternatives for the proposed anesthesia with the patient or authorized representative who has indicated  his/her understanding and acceptance.     Dental Advisory Given  Plan Discussed with: CRNA and Surgeon  Anesthesia Plan Comments:         Anesthesia Quick Evaluation

## 2021-04-17 NOTE — Transfer of Care (Signed)
Immediate Anesthesia Transfer of Care Note  Patient: Steven Morris  Procedure(s) Performed: COLONOSCOPY WITH PROPOFOL  Patient Location: Endoscopy Unit  Anesthesia Type:General  Level of Consciousness: awake, alert  and oriented  Airway & Oxygen Therapy: Patient Spontanous Breathing  Post-op Assessment: Report given to RN and Post -op Vital signs reviewed and stable  Post vital signs: Reviewed and stable  Last Vitals:  Vitals Value Taken Time  BP    Temp    Pulse 83 04/17/21 1430  Resp 33 04/17/21 1430  SpO2 93 % 04/17/21 1430    Last Pain:  Vitals:   04/17/21 1429  TempSrc: (P) Temporal  PainSc:          Complications: No notable events documented.

## 2021-04-17 NOTE — Interval H&P Note (Signed)
History and Physical Interval Note:  04/17/2021 1:37 PM  Steven Morris  has presented today for surgery, with the diagnosis of POSITIVE COLOGUARD TEST.  The various methods of treatment have been discussed with the patient and family. After consideration of risks, benefits and other options for treatment, the patient has consented to  Procedure(s): COLONOSCOPY WITH PROPOFOL (N/A) as a surgical intervention.  The patient's history has been reviewed, patient examined, no change in status, stable for surgery.  I have reviewed the patient's chart and labs.  Questions were answered to the patient's satisfaction.     [place], [place]

## 2021-04-18 ENCOUNTER — Encounter: Payer: Self-pay | Admitting: Internal Medicine

## 2021-04-18 NOTE — Anesthesia Postprocedure Evaluation (Signed)
Anesthesia Post Note  Patient: Steven Morris  Procedure(s) Performed: COLONOSCOPY WITH PROPOFOL  Patient location during evaluation: PACU Anesthesia Type: General Level of consciousness: awake and awake and alert Pain management: pain level controlled Vital Signs Assessment: post-procedure vital signs reviewed and stable Respiratory status: spontaneous breathing and respiratory function stable Cardiovascular status: blood pressure returned to baseline Postop Assessment: no headache Anesthetic complications: no   No notable events documented.   Last Vitals:  Vitals:   04/17/21 1439 04/17/21 1449  BP: 134/68 (!) 151/74  Pulse: 86 82  Resp: (!) 26 19  Temp:    SpO2: 96% 97%    Last Pain:  Vitals:   04/17/21 1449  TempSrc:   PainSc: 0-No pain                 VAN STAVEREN,Desiraye Rolfson

## 2021-04-19 LAB — SURGICAL PATHOLOGY

## 2021-06-20 DIAGNOSIS — R0609 Other forms of dyspnea: Secondary | ICD-10-CM | POA: Diagnosis not present

## 2021-06-20 DIAGNOSIS — J449 Chronic obstructive pulmonary disease, unspecified: Secondary | ICD-10-CM | POA: Diagnosis not present

## 2021-06-20 DIAGNOSIS — J439 Emphysema, unspecified: Secondary | ICD-10-CM | POA: Diagnosis not present

## 2021-06-20 DIAGNOSIS — G4734 Idiopathic sleep related nonobstructive alveolar hypoventilation: Secondary | ICD-10-CM | POA: Diagnosis not present

## 2021-06-20 DIAGNOSIS — R053 Chronic cough: Secondary | ICD-10-CM | POA: Diagnosis not present

## 2021-07-18 DIAGNOSIS — J449 Chronic obstructive pulmonary disease, unspecified: Secondary | ICD-10-CM | POA: Diagnosis not present

## 2021-08-18 DIAGNOSIS — J449 Chronic obstructive pulmonary disease, unspecified: Secondary | ICD-10-CM | POA: Diagnosis not present

## 2021-09-16 ENCOUNTER — Ambulatory Visit
Admission: RE | Admit: 2021-09-16 | Discharge: 2021-09-16 | Disposition: A | Discharge status: Home / Self Care | Payer: Medicare HMO | Source: Ambulatory Visit | Attending: Specialist | Admitting: Specialist

## 2021-09-16 DIAGNOSIS — J439 Emphysema, unspecified: Secondary | ICD-10-CM | POA: Diagnosis not present

## 2021-09-16 DIAGNOSIS — R918 Other nonspecific abnormal finding of lung field: Secondary | ICD-10-CM | POA: Insufficient documentation

## 2021-09-16 DIAGNOSIS — I700 Atherosclerosis of aorta: Secondary | ICD-10-CM | POA: Diagnosis not present

## 2021-09-17 DIAGNOSIS — I700 Atherosclerosis of aorta: Secondary | ICD-10-CM | POA: Diagnosis not present

## 2021-09-17 DIAGNOSIS — J449 Chronic obstructive pulmonary disease, unspecified: Secondary | ICD-10-CM | POA: Diagnosis not present

## 2021-09-17 DIAGNOSIS — F17210 Nicotine dependence, cigarettes, uncomplicated: Secondary | ICD-10-CM | POA: Diagnosis not present

## 2021-09-17 DIAGNOSIS — J438 Other emphysema: Secondary | ICD-10-CM | POA: Diagnosis not present

## 2021-09-17 DIAGNOSIS — Z125 Encounter for screening for malignant neoplasm of prostate: Secondary | ICD-10-CM | POA: Diagnosis not present

## 2021-09-17 DIAGNOSIS — R918 Other nonspecific abnormal finding of lung field: Secondary | ICD-10-CM | POA: Diagnosis not present

## 2021-09-17 DIAGNOSIS — R0609 Other forms of dyspnea: Secondary | ICD-10-CM | POA: Diagnosis not present

## 2021-09-17 DIAGNOSIS — I10 Essential (primary) hypertension: Secondary | ICD-10-CM | POA: Diagnosis not present

## 2021-09-17 DIAGNOSIS — R739 Hyperglycemia, unspecified: Secondary | ICD-10-CM | POA: Diagnosis not present

## 2021-09-17 DIAGNOSIS — E785 Hyperlipidemia, unspecified: Secondary | ICD-10-CM | POA: Diagnosis not present

## 2021-09-17 DIAGNOSIS — J441 Chronic obstructive pulmonary disease with (acute) exacerbation: Secondary | ICD-10-CM | POA: Diagnosis not present

## 2021-09-24 DIAGNOSIS — Z0000 Encounter for general adult medical examination without abnormal findings: Secondary | ICD-10-CM | POA: Diagnosis not present

## 2021-09-24 DIAGNOSIS — E785 Hyperlipidemia, unspecified: Secondary | ICD-10-CM | POA: Diagnosis not present

## 2021-09-24 DIAGNOSIS — Z1389 Encounter for screening for other disorder: Secondary | ICD-10-CM | POA: Diagnosis not present

## 2021-09-24 DIAGNOSIS — R7303 Prediabetes: Secondary | ICD-10-CM | POA: Diagnosis not present

## 2021-09-24 DIAGNOSIS — I700 Atherosclerosis of aorta: Secondary | ICD-10-CM | POA: Diagnosis not present

## 2021-09-24 DIAGNOSIS — J449 Chronic obstructive pulmonary disease, unspecified: Secondary | ICD-10-CM | POA: Diagnosis not present

## 2021-09-24 DIAGNOSIS — I2510 Atherosclerotic heart disease of native coronary artery without angina pectoris: Secondary | ICD-10-CM | POA: Diagnosis not present

## 2021-09-24 DIAGNOSIS — R972 Elevated prostate specific antigen [PSA]: Secondary | ICD-10-CM | POA: Diagnosis not present

## 2021-09-24 DIAGNOSIS — I10 Essential (primary) hypertension: Secondary | ICD-10-CM | POA: Diagnosis not present

## 2021-10-18 DIAGNOSIS — J449 Chronic obstructive pulmonary disease, unspecified: Secondary | ICD-10-CM | POA: Diagnosis not present

## 2021-10-25 DIAGNOSIS — R972 Elevated prostate specific antigen [PSA]: Secondary | ICD-10-CM | POA: Diagnosis not present

## 2021-11-17 DIAGNOSIS — J449 Chronic obstructive pulmonary disease, unspecified: Secondary | ICD-10-CM | POA: Diagnosis not present

## 2022-03-19 DIAGNOSIS — E785 Hyperlipidemia, unspecified: Secondary | ICD-10-CM | POA: Diagnosis not present

## 2022-03-19 DIAGNOSIS — R911 Solitary pulmonary nodule: Secondary | ICD-10-CM | POA: Diagnosis not present

## 2022-03-19 DIAGNOSIS — R053 Chronic cough: Secondary | ICD-10-CM | POA: Diagnosis not present

## 2022-03-19 DIAGNOSIS — J449 Chronic obstructive pulmonary disease, unspecified: Secondary | ICD-10-CM | POA: Diagnosis not present

## 2022-03-19 DIAGNOSIS — R7303 Prediabetes: Secondary | ICD-10-CM | POA: Diagnosis not present

## 2022-03-19 DIAGNOSIS — I700 Atherosclerosis of aorta: Secondary | ICD-10-CM | POA: Diagnosis not present

## 2022-03-19 DIAGNOSIS — R918 Other nonspecific abnormal finding of lung field: Secondary | ICD-10-CM | POA: Diagnosis not present

## 2022-03-19 DIAGNOSIS — G4734 Idiopathic sleep related nonobstructive alveolar hypoventilation: Secondary | ICD-10-CM | POA: Diagnosis not present

## 2022-03-19 DIAGNOSIS — I2510 Atherosclerotic heart disease of native coronary artery without angina pectoris: Secondary | ICD-10-CM | POA: Diagnosis not present

## 2022-03-19 DIAGNOSIS — R0609 Other forms of dyspnea: Secondary | ICD-10-CM | POA: Diagnosis not present

## 2022-03-19 DIAGNOSIS — F17218 Nicotine dependence, cigarettes, with other nicotine-induced disorders: Secondary | ICD-10-CM | POA: Diagnosis not present

## 2022-03-19 DIAGNOSIS — J439 Emphysema, unspecified: Secondary | ICD-10-CM | POA: Diagnosis not present

## 2022-03-19 DIAGNOSIS — I10 Essential (primary) hypertension: Secondary | ICD-10-CM | POA: Diagnosis not present

## 2022-03-26 DIAGNOSIS — I700 Atherosclerosis of aorta: Secondary | ICD-10-CM | POA: Diagnosis not present

## 2022-03-26 DIAGNOSIS — Z125 Encounter for screening for malignant neoplasm of prostate: Secondary | ICD-10-CM | POA: Diagnosis not present

## 2022-03-26 DIAGNOSIS — F17210 Nicotine dependence, cigarettes, uncomplicated: Secondary | ICD-10-CM | POA: Diagnosis not present

## 2022-03-26 DIAGNOSIS — E785 Hyperlipidemia, unspecified: Secondary | ICD-10-CM | POA: Diagnosis not present

## 2022-03-26 DIAGNOSIS — J449 Chronic obstructive pulmonary disease, unspecified: Secondary | ICD-10-CM | POA: Diagnosis not present

## 2022-03-26 DIAGNOSIS — I2510 Atherosclerotic heart disease of native coronary artery without angina pectoris: Secondary | ICD-10-CM | POA: Diagnosis not present

## 2022-03-26 DIAGNOSIS — R7303 Prediabetes: Secondary | ICD-10-CM | POA: Diagnosis not present

## 2022-03-26 DIAGNOSIS — I10 Essential (primary) hypertension: Secondary | ICD-10-CM | POA: Diagnosis not present

## 2022-03-26 IMAGING — CT CT CHEST W/O CM
2 of 4 series · 15 of 36 positions shown, 18 images · non-contrast
Comparison: Chest CT dated 08/28/2020.

CLINICAL DATA: Follow-up lung nodule.

EXAM:
CT CHEST WITHOUT CONTRAST
TECHNIQUE: Multidetector CT imaging of the chest was performed following the
standard protocol without IV contrast.

[Series 2: chest 2.00 · axial · 0.63mm/px · z∈[-1215,-929]mm · 12 of 169 slices shown, 15 images]
[im 13/169  mediastinal]
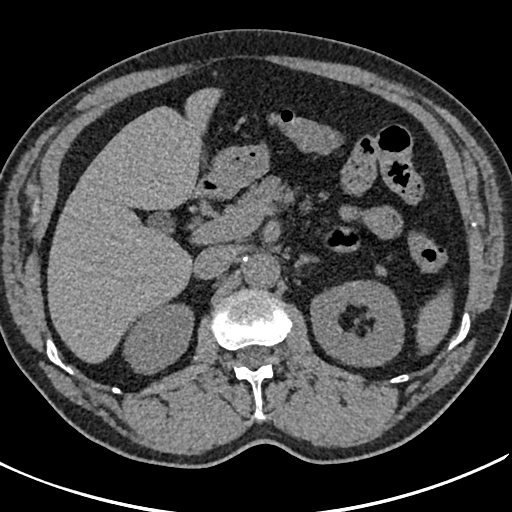
[im 13/169  lung]
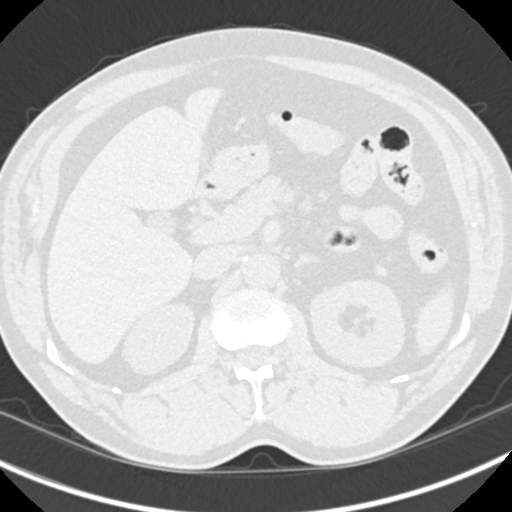
[im 26/169  lung]
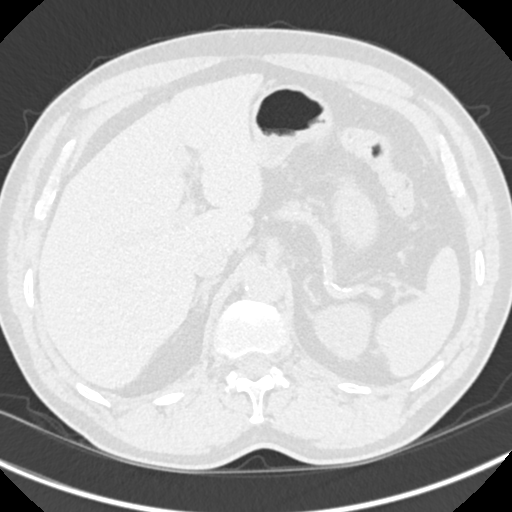
[im 39/169  lung]
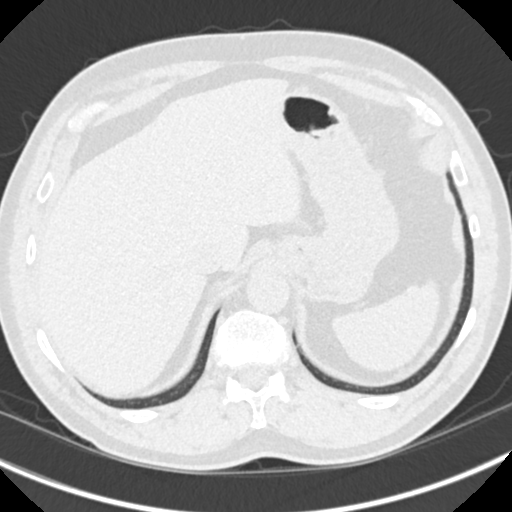
[im 52/169  lung]
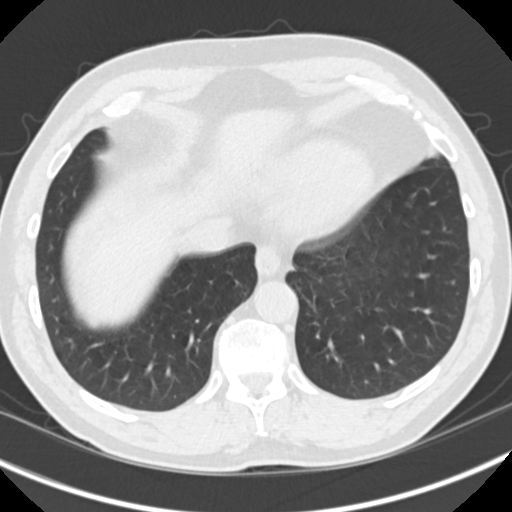
[im 65/169  mediastinal]
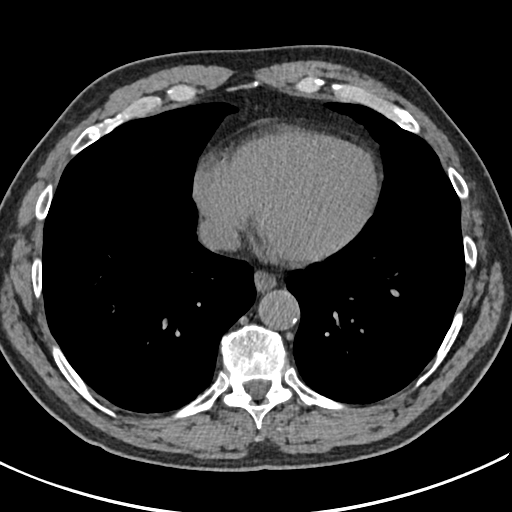
[im 65/169  lung]
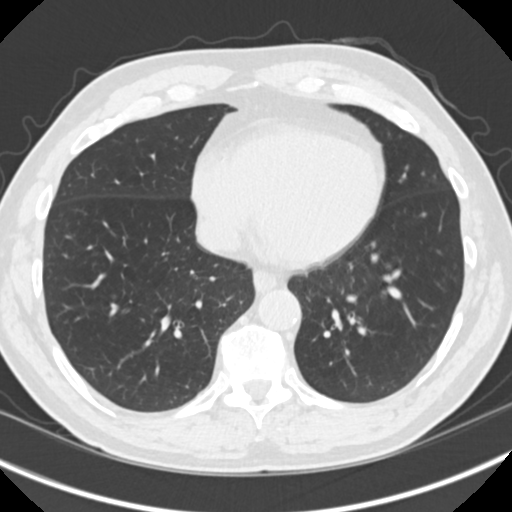
[im 78/169  lung]
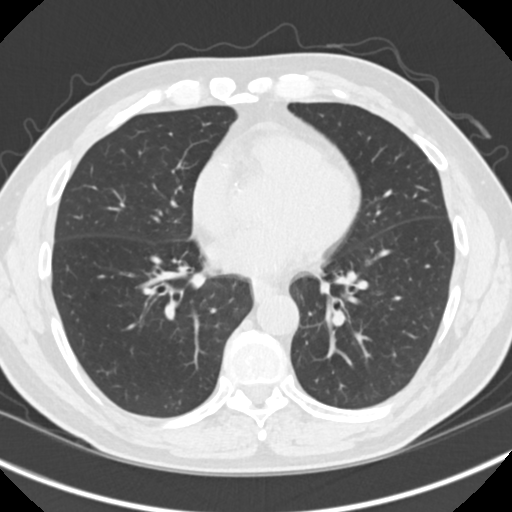
[im 91/169  lung]
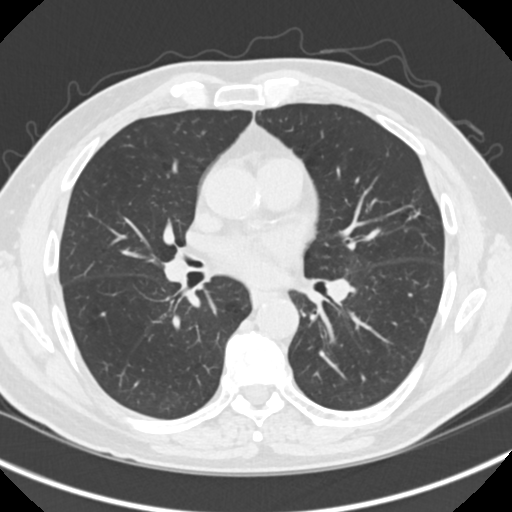
[im 104/169  lung]
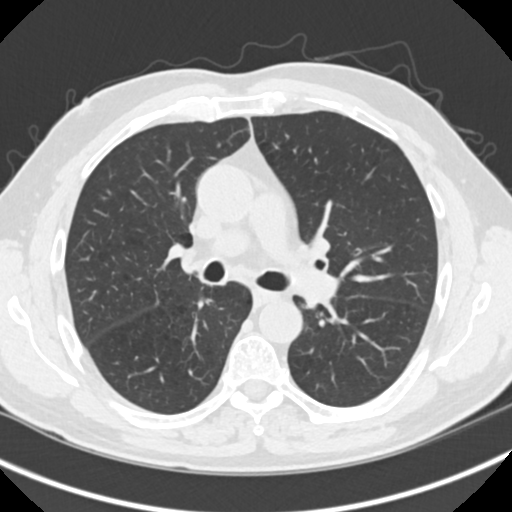
[im 117/169  mediastinal]
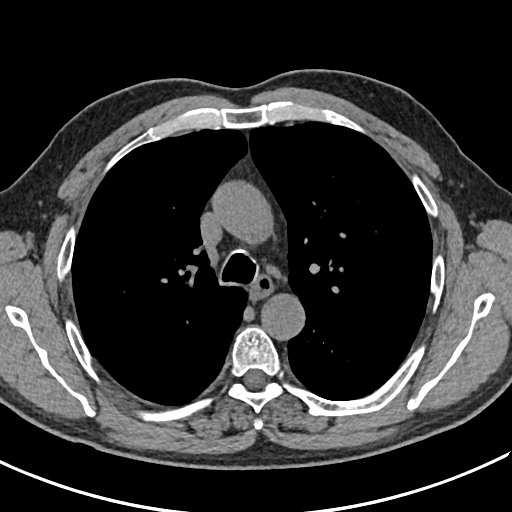
[im 117/169  lung]
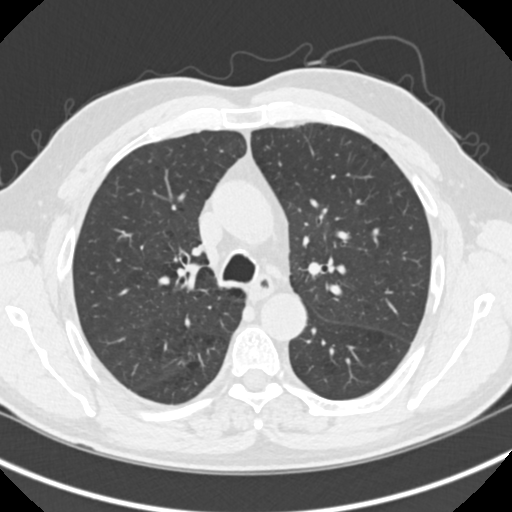
[im 130/169  lung]
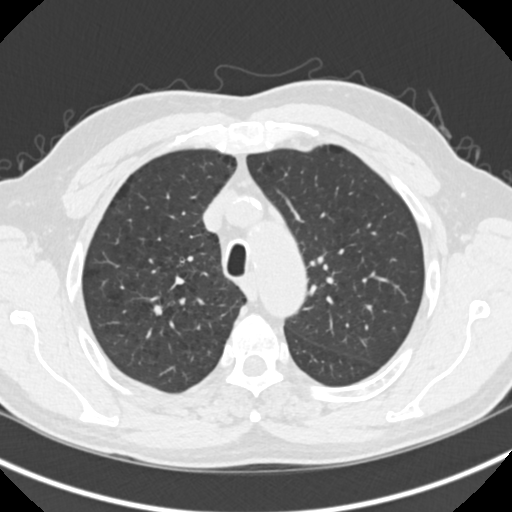
[im 143/169  lung]
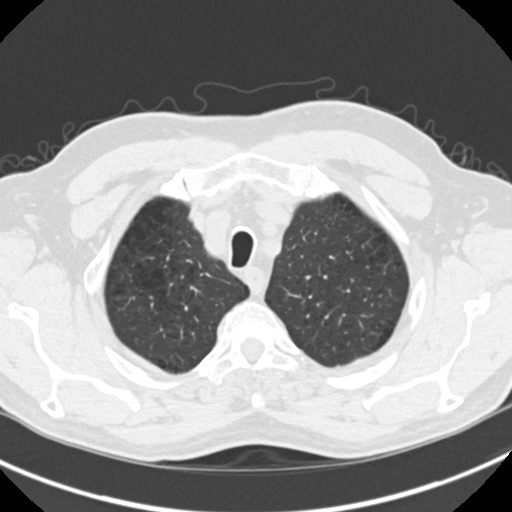
[im 156/169  lung]
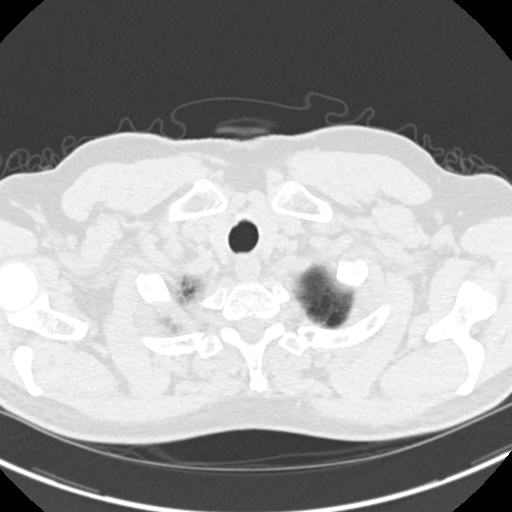

[Series 5: coronals chest 2.00 cor · coronal · 0.63mm/px · 3 of 140 slices shown]
[im 28/140  lung]
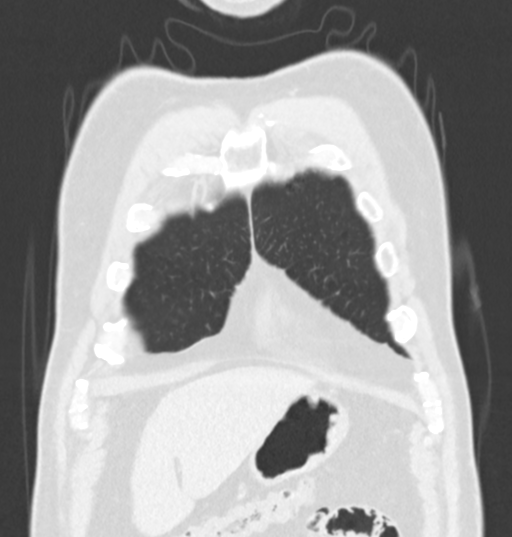
[im 56/140  lung]
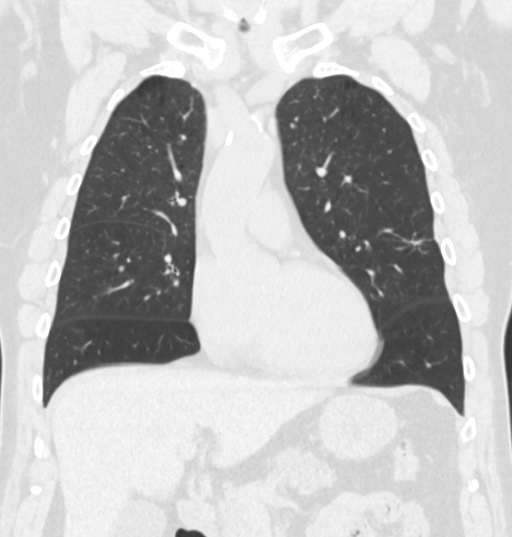
[im 84/140  lung]
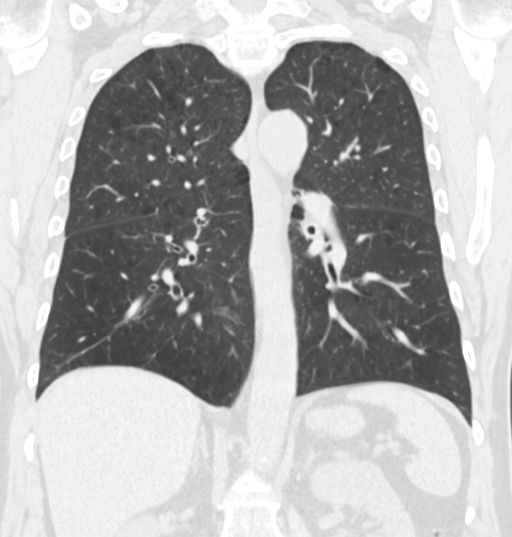

[15 of 36 positions shown; findings below may reference images not displayed]

FINDINGS: Cardiovascular: There is no cardiomegaly or pericardial effusion.
Coronary vascular calcification. There is atherosclerotic
calcification of the thoracic aorta. No aneurysmal dilatation. The
central pulmonary arteries are grossly unremarkable.

Mediastinum/Nodes: No hilar or mediastinal adenopathy. The esophagus
and the thyroid gland are grossly unremarkable. No mediastinal fluid
collection.

Lungs/Pleura: There is a 6 x 8 mm nodule with spiculated margin in
the lingula unchanged since the prior CT. Additional nodule in the
right upper lobe measures 5 x 6 mm similar to prior CT. There is
background of centrilobular emphysema. No focal consolidation,
pleural effusion, or pneumothorax. The central airways are patent.

Upper Abdomen: No acute abnormality.

Musculoskeletal: No chest wall mass or suspicious bone lesions
identified.
IMPRESSION: 1. No acute intrathoracic pathology.
2. No interval change in the bilateral pulmonary nodules compared to
the CT of 08/28/2020. Continued follow-up as clinically indicated.
3. Aortic Atherosclerosis (I8C32-NLX.X) and Emphysema (I8C32-RS1.K).

## 2022-06-26 DIAGNOSIS — J449 Chronic obstructive pulmonary disease, unspecified: Secondary | ICD-10-CM | POA: Diagnosis not present

## 2022-06-26 DIAGNOSIS — R0602 Shortness of breath: Secondary | ICD-10-CM | POA: Diagnosis not present

## 2022-06-26 DIAGNOSIS — F17210 Nicotine dependence, cigarettes, uncomplicated: Secondary | ICD-10-CM | POA: Diagnosis not present

## 2022-06-26 DIAGNOSIS — R918 Other nonspecific abnormal finding of lung field: Secondary | ICD-10-CM | POA: Diagnosis not present

## 2022-09-04 ENCOUNTER — Other Ambulatory Visit: Payer: Self-pay | Admitting: Specialist

## 2022-09-04 DIAGNOSIS — F17210 Nicotine dependence, cigarettes, uncomplicated: Secondary | ICD-10-CM

## 2022-09-04 DIAGNOSIS — R918 Other nonspecific abnormal finding of lung field: Secondary | ICD-10-CM

## 2022-09-11 ENCOUNTER — Ambulatory Visit
Admission: RE | Admit: 2022-09-11 | Discharge: 2022-09-11 | Disposition: A | Discharge status: Home / Self Care | Payer: Medicare HMO | Source: Ambulatory Visit | Attending: Specialist | Admitting: Specialist

## 2022-09-11 DIAGNOSIS — F17210 Nicotine dependence, cigarettes, uncomplicated: Secondary | ICD-10-CM | POA: Diagnosis not present

## 2022-09-11 DIAGNOSIS — R918 Other nonspecific abnormal finding of lung field: Secondary | ICD-10-CM | POA: Insufficient documentation

## 2022-09-11 DIAGNOSIS — J449 Chronic obstructive pulmonary disease, unspecified: Secondary | ICD-10-CM | POA: Diagnosis not present

## 2022-09-11 DIAGNOSIS — J432 Centrilobular emphysema: Secondary | ICD-10-CM | POA: Diagnosis not present

## 2022-09-11 DIAGNOSIS — J9809 Other diseases of bronchus, not elsewhere classified: Secondary | ICD-10-CM | POA: Diagnosis not present

## 2022-09-25 DIAGNOSIS — J449 Chronic obstructive pulmonary disease, unspecified: Secondary | ICD-10-CM | POA: Diagnosis not present

## 2022-09-25 DIAGNOSIS — R918 Other nonspecific abnormal finding of lung field: Secondary | ICD-10-CM | POA: Diagnosis not present

## 2022-09-25 DIAGNOSIS — F17210 Nicotine dependence, cigarettes, uncomplicated: Secondary | ICD-10-CM | POA: Diagnosis not present

## 2022-09-25 DIAGNOSIS — R0609 Other forms of dyspnea: Secondary | ICD-10-CM | POA: Diagnosis not present

## 2022-09-25 DIAGNOSIS — G4734 Idiopathic sleep related nonobstructive alveolar hypoventilation: Secondary | ICD-10-CM | POA: Diagnosis not present

## 2022-09-29 IMAGING — CT CT CHEST W/O CM
2 of 4 series · 15 of 36 positions shown, 18 images · non-contrast
Comparison: Multiple priors including most recent CT March 13, 2021

CLINICAL DATA: Follow-up pulmonary nodules.



[Series 2: chest 2.00 · axial · 0.63mm/px · z∈[-1199,-917]mm · 12 of 167 slices shown, 15 images]
[im 13/167  mediastinal]
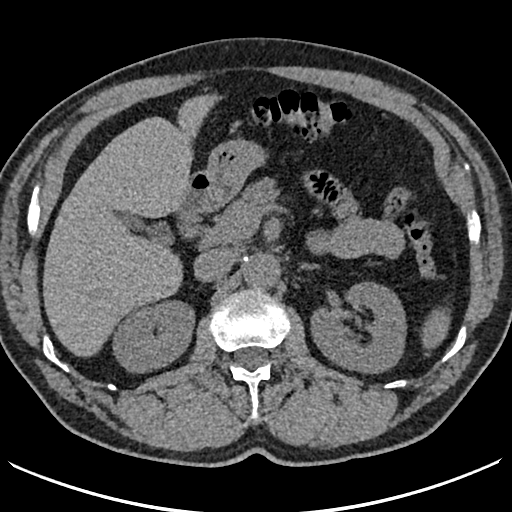
[im 13/167  lung]
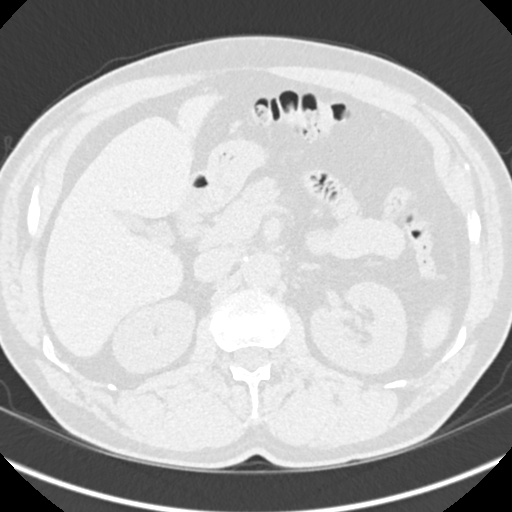
[im 26/167  lung]
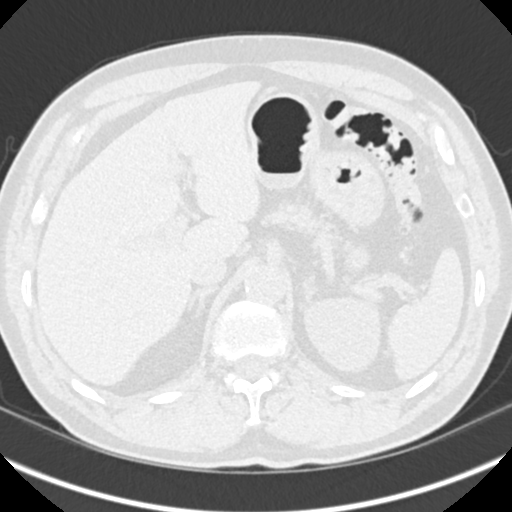
[im 39/167  lung]
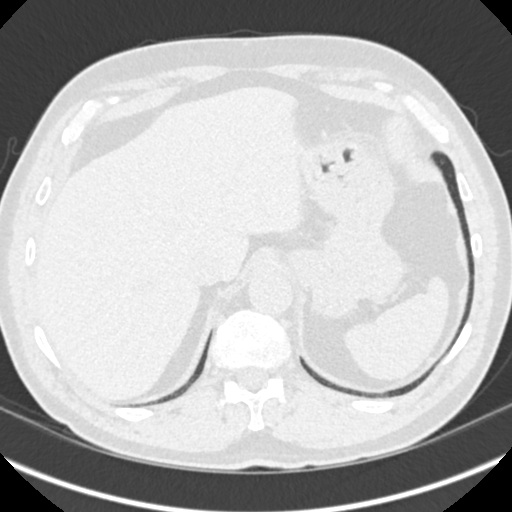
[im 52/167  lung]
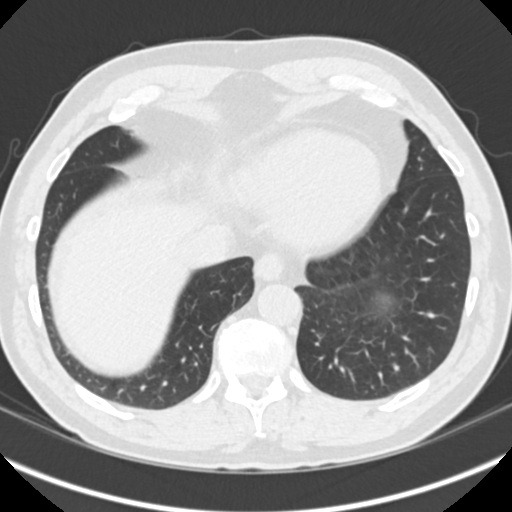
[im 64/167  mediastinal]
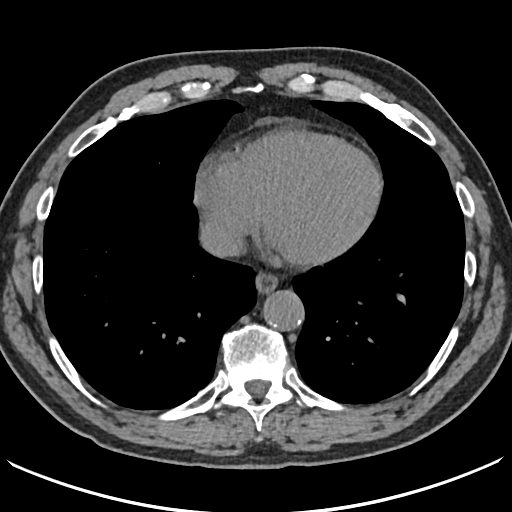
[im 64/167  lung]
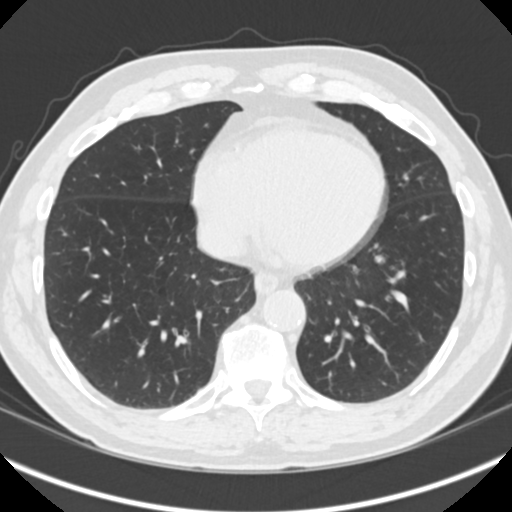
[im 77/167  lung]
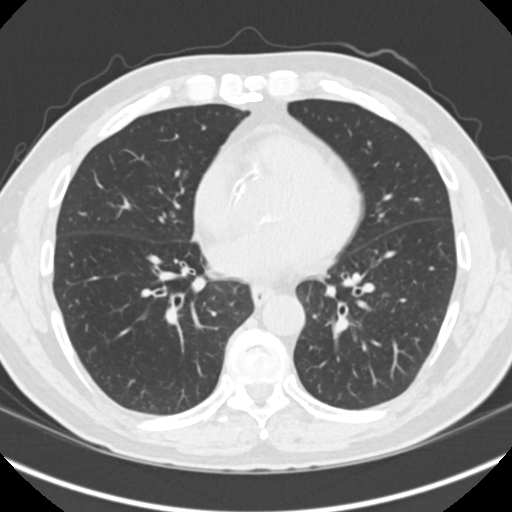
[im 90/167  lung]
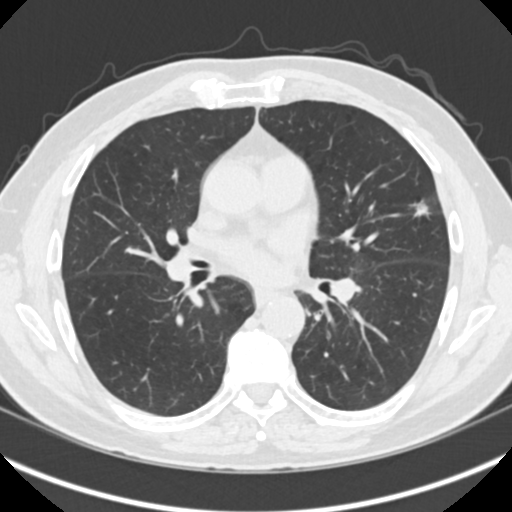
[im 103/167  lung]
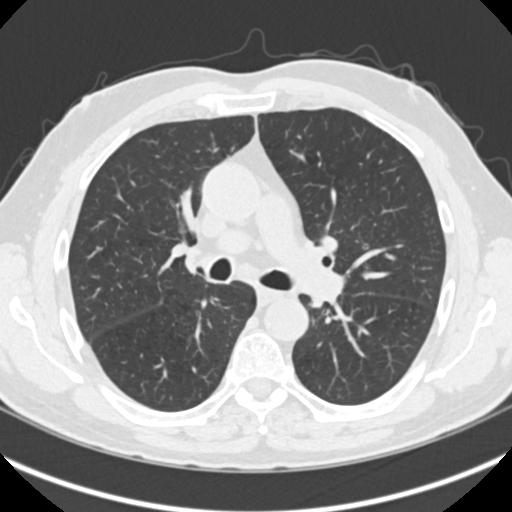
[im 115/167  mediastinal]
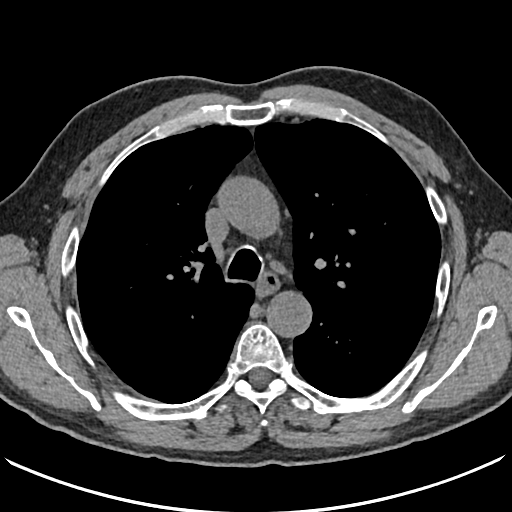
[im 115/167  lung]
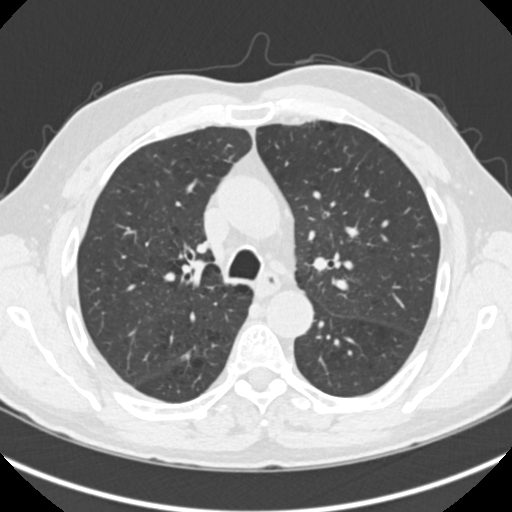
[im 128/167  lung]
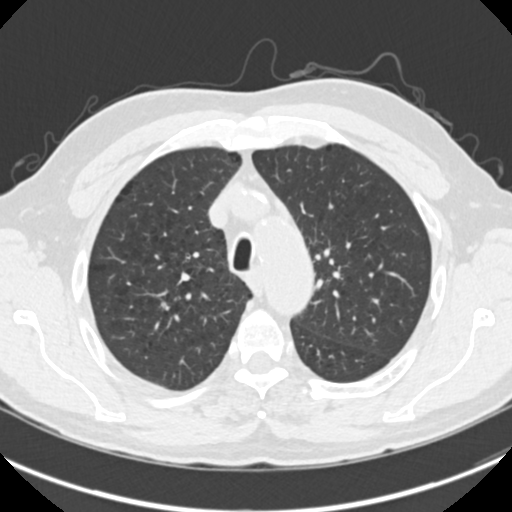
[im 141/167  lung]
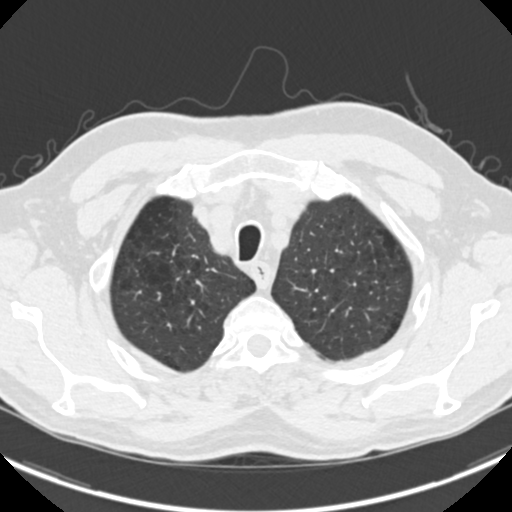
[im 154/167  lung]
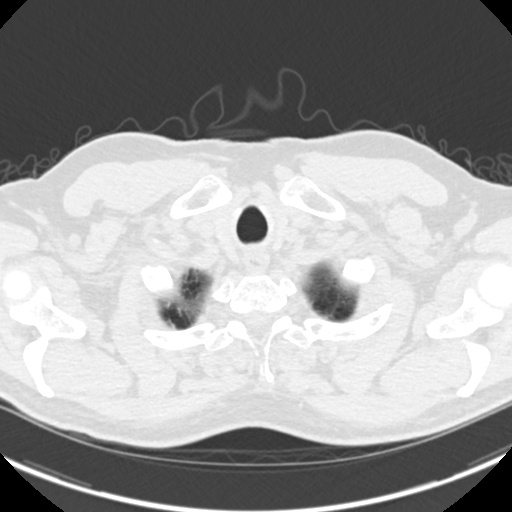

[Series 5: coronals chest 2.00 cor · coronal · 0.63mm/px · 3 of 138 slices shown]
[im 28/138  lung]
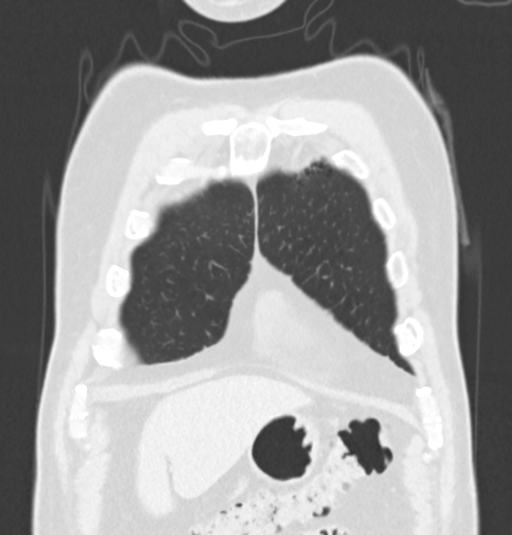
[im 55/138  lung]
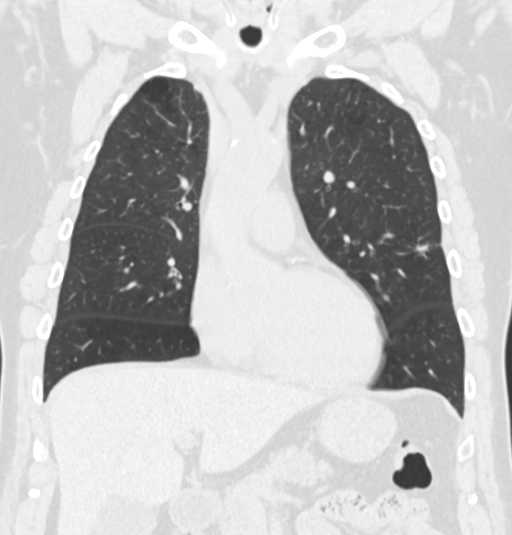
[im 83/138  lung]
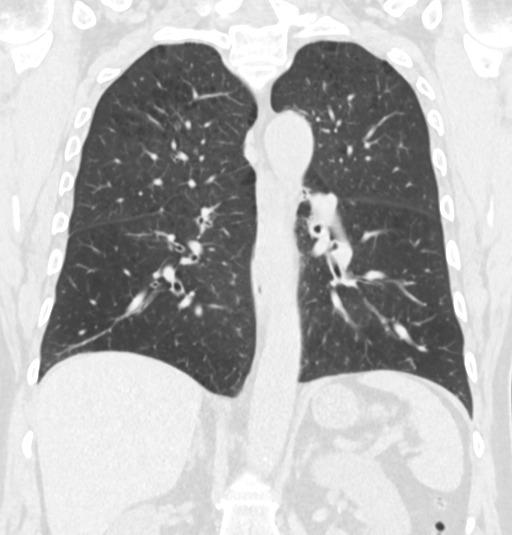

[15 of 36 positions shown; findings below may reference images not displayed]

FINDINGS: Cardiovascular: Thoracic aortic atherosclerosis without aneurysmal
dilation. Calcifications aortic annulus and coronary arteries.
Normal size heart. Trace pericardial effusion is similar prior and
likely physiologic.

Mediastinum/Nodes: No discrete thyroid nodule. Prominent mediastinal
lymph nodes measuring up to 7 mm in the subcarinal station on image
66/2, are similar prior and favored reactive. No pathologically
enlarged mediastinal, hilar or axillary lymph nodes, noting limited
sensitivity for the detection of hilar adenopathy on this
noncontrast study.

Lungs/Pleura: Centrilobular and paraseptal emphysema. Diffuse
bronchial wall thickening. No pleural effusion. No pneumothorax.

No new suspicious pulmonary nodules or masses. Index pulmonary
nodules are as follows:

-spiculated solid lingular pulmonary nodule measures 8 x 6 mm on
image 78/3, unchanged.

-solid right upper lobe pulmonary nodule measures 6 x 5 mm on image
39/3, unchanged.

Upper Abdomen: No acute abnormality.

Musculoskeletal: No chest wall mass or suspicious bone lesions
identified.
IMPRESSION: 1. Stable bilateral pulmonary nodules measuring up to 8 mm in the
lingula and 6 mm in the right upper lobe.
2. No new suspicious pulmonary nodules or masses.
3. Aortic Atherosclerosis (XVH02-S94.4) and Emphysema (XVH02-3R2.I).

## 2022-10-06 DIAGNOSIS — I700 Atherosclerosis of aorta: Secondary | ICD-10-CM | POA: Diagnosis not present

## 2022-10-06 DIAGNOSIS — J438 Other emphysema: Secondary | ICD-10-CM | POA: Diagnosis not present

## 2022-10-06 DIAGNOSIS — I2510 Atherosclerotic heart disease of native coronary artery without angina pectoris: Secondary | ICD-10-CM | POA: Diagnosis not present

## 2022-10-06 DIAGNOSIS — R7303 Prediabetes: Secondary | ICD-10-CM | POA: Diagnosis not present

## 2022-10-06 DIAGNOSIS — E785 Hyperlipidemia, unspecified: Secondary | ICD-10-CM | POA: Diagnosis not present

## 2022-10-06 DIAGNOSIS — Z125 Encounter for screening for malignant neoplasm of prostate: Secondary | ICD-10-CM | POA: Diagnosis not present

## 2022-10-06 DIAGNOSIS — I10 Essential (primary) hypertension: Secondary | ICD-10-CM | POA: Diagnosis not present

## 2022-10-10 DIAGNOSIS — E785 Hyperlipidemia, unspecified: Secondary | ICD-10-CM | POA: Diagnosis not present

## 2022-10-10 DIAGNOSIS — F17210 Nicotine dependence, cigarettes, uncomplicated: Secondary | ICD-10-CM | POA: Diagnosis not present

## 2022-10-10 DIAGNOSIS — I10 Essential (primary) hypertension: Secondary | ICD-10-CM | POA: Diagnosis not present

## 2022-10-10 DIAGNOSIS — Z1331 Encounter for screening for depression: Secondary | ICD-10-CM | POA: Diagnosis not present

## 2022-10-10 DIAGNOSIS — I2510 Atherosclerotic heart disease of native coronary artery without angina pectoris: Secondary | ICD-10-CM | POA: Diagnosis not present

## 2022-10-10 DIAGNOSIS — J449 Chronic obstructive pulmonary disease, unspecified: Secondary | ICD-10-CM | POA: Diagnosis not present

## 2022-10-10 DIAGNOSIS — Z0000 Encounter for general adult medical examination without abnormal findings: Secondary | ICD-10-CM | POA: Diagnosis not present

## 2022-10-10 DIAGNOSIS — R7303 Prediabetes: Secondary | ICD-10-CM | POA: Diagnosis not present

## 2022-10-10 DIAGNOSIS — I700 Atherosclerosis of aorta: Secondary | ICD-10-CM | POA: Diagnosis not present

## 2023-01-08 DIAGNOSIS — R918 Other nonspecific abnormal finding of lung field: Secondary | ICD-10-CM | POA: Diagnosis not present

## 2023-01-08 DIAGNOSIS — R0602 Shortness of breath: Secondary | ICD-10-CM | POA: Diagnosis not present

## 2023-01-08 DIAGNOSIS — Z9981 Dependence on supplemental oxygen: Secondary | ICD-10-CM | POA: Diagnosis not present

## 2023-01-08 DIAGNOSIS — F17218 Nicotine dependence, cigarettes, with other nicotine-induced disorders: Secondary | ICD-10-CM | POA: Diagnosis not present

## 2023-01-08 DIAGNOSIS — J449 Chronic obstructive pulmonary disease, unspecified: Secondary | ICD-10-CM | POA: Diagnosis not present

## 2023-01-08 DIAGNOSIS — R058 Other specified cough: Secondary | ICD-10-CM | POA: Diagnosis not present

## 2023-01-08 DIAGNOSIS — T464X5A Adverse effect of angiotensin-converting-enzyme inhibitors, initial encounter: Secondary | ICD-10-CM | POA: Diagnosis not present

## 2023-02-26 ENCOUNTER — Other Ambulatory Visit: Payer: Self-pay | Admitting: Specialist

## 2023-02-26 DIAGNOSIS — F17210 Nicotine dependence, cigarettes, uncomplicated: Secondary | ICD-10-CM

## 2023-02-26 DIAGNOSIS — R918 Other nonspecific abnormal finding of lung field: Secondary | ICD-10-CM

## 2023-03-20 ENCOUNTER — Ambulatory Visit
Admission: RE | Admit: 2023-03-20 | Discharge: 2023-03-20 | Disposition: A | Discharge status: Home / Self Care | Payer: Medicare HMO | Source: Ambulatory Visit | Attending: Specialist | Admitting: Specialist

## 2023-03-20 DIAGNOSIS — J432 Centrilobular emphysema: Secondary | ICD-10-CM | POA: Diagnosis not present

## 2023-03-20 DIAGNOSIS — I700 Atherosclerosis of aorta: Secondary | ICD-10-CM | POA: Diagnosis not present

## 2023-03-20 DIAGNOSIS — F17210 Nicotine dependence, cigarettes, uncomplicated: Secondary | ICD-10-CM | POA: Diagnosis not present

## 2023-03-20 DIAGNOSIS — R918 Other nonspecific abnormal finding of lung field: Secondary | ICD-10-CM | POA: Insufficient documentation

## 2023-03-27 DIAGNOSIS — I700 Atherosclerosis of aorta: Secondary | ICD-10-CM | POA: Diagnosis not present

## 2023-03-27 DIAGNOSIS — J438 Other emphysema: Secondary | ICD-10-CM | POA: Diagnosis not present

## 2023-03-27 DIAGNOSIS — E785 Hyperlipidemia, unspecified: Secondary | ICD-10-CM | POA: Diagnosis not present

## 2023-03-27 DIAGNOSIS — I10 Essential (primary) hypertension: Secondary | ICD-10-CM | POA: Diagnosis not present

## 2023-03-27 DIAGNOSIS — R7303 Prediabetes: Secondary | ICD-10-CM | POA: Diagnosis not present

## 2023-04-03 DIAGNOSIS — E785 Hyperlipidemia, unspecified: Secondary | ICD-10-CM | POA: Diagnosis not present

## 2023-04-03 DIAGNOSIS — I10 Essential (primary) hypertension: Secondary | ICD-10-CM | POA: Diagnosis not present

## 2023-04-03 DIAGNOSIS — I700 Atherosclerosis of aorta: Secondary | ICD-10-CM | POA: Diagnosis not present

## 2023-04-03 DIAGNOSIS — J449 Chronic obstructive pulmonary disease, unspecified: Secondary | ICD-10-CM | POA: Diagnosis not present

## 2023-04-03 DIAGNOSIS — R7303 Prediabetes: Secondary | ICD-10-CM | POA: Diagnosis not present

## 2023-04-03 DIAGNOSIS — Z125 Encounter for screening for malignant neoplasm of prostate: Secondary | ICD-10-CM | POA: Diagnosis not present

## 2023-04-03 DIAGNOSIS — F17210 Nicotine dependence, cigarettes, uncomplicated: Secondary | ICD-10-CM | POA: Diagnosis not present

## 2023-05-07 DIAGNOSIS — R911 Solitary pulmonary nodule: Secondary | ICD-10-CM | POA: Diagnosis not present

## 2023-05-07 DIAGNOSIS — F17210 Nicotine dependence, cigarettes, uncomplicated: Secondary | ICD-10-CM | POA: Diagnosis not present

## 2023-05-07 DIAGNOSIS — J449 Chronic obstructive pulmonary disease, unspecified: Secondary | ICD-10-CM | POA: Diagnosis not present

## 2023-05-07 DIAGNOSIS — G4734 Idiopathic sleep related nonobstructive alveolar hypoventilation: Secondary | ICD-10-CM | POA: Diagnosis not present

## 2023-09-07 DIAGNOSIS — J449 Chronic obstructive pulmonary disease, unspecified: Secondary | ICD-10-CM | POA: Diagnosis not present

## 2023-09-07 DIAGNOSIS — R918 Other nonspecific abnormal finding of lung field: Secondary | ICD-10-CM | POA: Diagnosis not present

## 2023-09-07 DIAGNOSIS — F17218 Nicotine dependence, cigarettes, with other nicotine-induced disorders: Secondary | ICD-10-CM | POA: Diagnosis not present

## 2023-09-07 DIAGNOSIS — R0902 Hypoxemia: Secondary | ICD-10-CM | POA: Diagnosis not present

## 2023-10-05 DIAGNOSIS — E785 Hyperlipidemia, unspecified: Secondary | ICD-10-CM | POA: Diagnosis not present

## 2023-10-05 DIAGNOSIS — R7303 Prediabetes: Secondary | ICD-10-CM | POA: Diagnosis not present

## 2023-10-05 DIAGNOSIS — Z125 Encounter for screening for malignant neoplasm of prostate: Secondary | ICD-10-CM | POA: Diagnosis not present

## 2023-10-05 DIAGNOSIS — I10 Essential (primary) hypertension: Secondary | ICD-10-CM | POA: Diagnosis not present

## 2023-10-07 DIAGNOSIS — F17210 Nicotine dependence, cigarettes, uncomplicated: Secondary | ICD-10-CM | POA: Diagnosis not present

## 2023-10-07 DIAGNOSIS — Z7982 Long term (current) use of aspirin: Secondary | ICD-10-CM | POA: Diagnosis not present

## 2023-10-07 DIAGNOSIS — I10 Essential (primary) hypertension: Secondary | ICD-10-CM | POA: Diagnosis not present

## 2023-10-07 DIAGNOSIS — I700 Atherosclerosis of aorta: Secondary | ICD-10-CM | POA: Diagnosis not present

## 2023-10-07 DIAGNOSIS — I252 Old myocardial infarction: Secondary | ICD-10-CM | POA: Diagnosis not present

## 2023-10-07 DIAGNOSIS — Z20822 Contact with and (suspected) exposure to covid-19: Secondary | ICD-10-CM | POA: Diagnosis not present

## 2023-10-07 DIAGNOSIS — E785 Hyperlipidemia, unspecified: Secondary | ICD-10-CM | POA: Diagnosis not present

## 2023-10-07 DIAGNOSIS — J441 Chronic obstructive pulmonary disease with (acute) exacerbation: Secondary | ICD-10-CM | POA: Diagnosis not present

## 2023-10-07 DIAGNOSIS — Z9981 Dependence on supplemental oxygen: Secondary | ICD-10-CM | POA: Diagnosis not present

## 2023-10-07 DIAGNOSIS — J9691 Respiratory failure, unspecified with hypoxia: Secondary | ICD-10-CM | POA: Diagnosis not present

## 2023-10-07 DIAGNOSIS — R0602 Shortness of breath: Secondary | ICD-10-CM | POA: Diagnosis not present

## 2023-10-08 DIAGNOSIS — F17210 Nicotine dependence, cigarettes, uncomplicated: Secondary | ICD-10-CM | POA: Diagnosis not present

## 2023-10-08 DIAGNOSIS — J9691 Respiratory failure, unspecified with hypoxia: Secondary | ICD-10-CM | POA: Diagnosis not present

## 2023-10-08 DIAGNOSIS — J441 Chronic obstructive pulmonary disease with (acute) exacerbation: Secondary | ICD-10-CM | POA: Diagnosis not present

## 2023-10-08 DIAGNOSIS — F17200 Nicotine dependence, unspecified, uncomplicated: Secondary | ICD-10-CM | POA: Diagnosis not present

## 2023-10-15 DIAGNOSIS — R911 Solitary pulmonary nodule: Secondary | ICD-10-CM | POA: Diagnosis not present

## 2023-10-15 DIAGNOSIS — F17210 Nicotine dependence, cigarettes, uncomplicated: Secondary | ICD-10-CM | POA: Diagnosis not present

## 2023-10-15 DIAGNOSIS — J438 Other emphysema: Secondary | ICD-10-CM | POA: Diagnosis not present

## 2023-11-11 DIAGNOSIS — R911 Solitary pulmonary nodule: Secondary | ICD-10-CM | POA: Diagnosis not present

## 2023-11-11 DIAGNOSIS — I10 Essential (primary) hypertension: Secondary | ICD-10-CM | POA: Diagnosis not present

## 2023-11-11 DIAGNOSIS — Z0000 Encounter for general adult medical examination without abnormal findings: Secondary | ICD-10-CM | POA: Diagnosis not present

## 2023-11-11 DIAGNOSIS — F17210 Nicotine dependence, cigarettes, uncomplicated: Secondary | ICD-10-CM | POA: Diagnosis not present

## 2023-11-11 DIAGNOSIS — Z1331 Encounter for screening for depression: Secondary | ICD-10-CM | POA: Diagnosis not present

## 2023-11-11 DIAGNOSIS — J449 Chronic obstructive pulmonary disease, unspecified: Secondary | ICD-10-CM | POA: Diagnosis not present

## 2023-11-11 DIAGNOSIS — R7303 Prediabetes: Secondary | ICD-10-CM | POA: Diagnosis not present

## 2023-11-11 DIAGNOSIS — I2510 Atherosclerotic heart disease of native coronary artery without angina pectoris: Secondary | ICD-10-CM | POA: Diagnosis not present

## 2023-11-11 DIAGNOSIS — I700 Atherosclerosis of aorta: Secondary | ICD-10-CM | POA: Diagnosis not present

## 2023-12-17 ENCOUNTER — Ambulatory Visit: Admitting: Urology

## 2023-12-17 VITALS — BP 147/64 | HR 87

## 2023-12-17 DIAGNOSIS — R972 Elevated prostate specific antigen [PSA]: Secondary | ICD-10-CM

## 2023-12-17 NOTE — Progress Notes (Signed)
 I, Maysun LITTIE Griffiths, acting as a scribe for Glendia JAYSON Barba, MD., have documented all relevant documentation on the behalf of Glendia JAYSON Barba, MD, as directed by Glendia JAYSON Barba, MD while in the presence of Glendia JAYSON Barba, MD.  12/17/2023 3:14 PM   Darina Favors 1955/05/29 [id number]  Referring provider: Fernande Ophelia JINNY DOUGLAS, MD [address] [place] [facility] [place],  [place] [address]  Chief Complaint  Patient presents with   Establish Care   Elevated PSA    HPI: Steven Morris is a 68 y.o. male referred for evaluation of an elevated PSA.  PSA 10/05/2023 elevated at 10.2. Urinalysis was also checked, which showed no evidence of infection.  Prior PSA May 2024 was 3.30. Since 2021 baseline PSA in the mid to low 2 range with the exception of PSA bump to 5.16 Sep 2021 No bothersome lower urinary tract symptoms No family history of prostate cancer.   PMH: Past Medical History:  Diagnosis Date   Aortic atherosclerosis (HCC)    COPD (chronic obstructive pulmonary disease) (HCC)    Coronary artery calcification seen on CT scan 09/18/2020   Dyspnea    Hypertension     Surgical History: Past Surgical History:  Procedure Laterality Date   APPENDECTOMY     COLONOSCOPY WITH PROPOFOL  N/A 04/17/2021   Procedure: COLONOSCOPY WITH PROPOFOL ;  Surgeon: [place], Ladell POUR, MD;  Location: [facility] ENDOSCOPY;  Service: Gastroenterology;  Laterality: N/A;    Home Medications:  Allergies as of 12/17/2023       Reactions   Enoxaparin Dermatitis, Itching   Levofloxacin Itching        Medication List        Accurate as of December 17, 2023  3:14 PM. If you have any questions, ask your nurse or doctor.          acetaminophen 500 MG tablet Commonly known as: TYLENOL Take 500 mg by mouth every 6 (six) hours as needed.   albuterol  108 (90 Base) MCG/ACT inhaler Commonly known as: VENTOLIN  HFA Inhale 2 puffs into the lungs every 4 (four) hours as needed for wheezing or shortness of  breath. What changed: when to take this   amLODipine  10 MG tablet Commonly known as: NORVASC  Take 1 tablet (10 mg total) by mouth daily.   ascorbic acid 250 MG tablet Commonly known as: VITAMIN C Take 250 mcg by mouth.   aspirin 81 MG chewable tablet Chew 81 mg by mouth daily.   benzonatate  100 MG capsule Commonly known as: TESSALON  Take 1 capsule (100 mg total) by mouth every 8 (eight) hours.   buPROPion 150 MG 24 hr tablet Commonly known as: WELLBUTRIN XL Take 150 mg by mouth daily.   methylPREDNISolone  4 MG Tbpk tablet Commonly known as: MEDROL  DOSEPAK Take 4-24 mg by mouth See admin instructions. (Typical regimens for 21 tablet dose packs of Methylprednisolone  4mg , Prednisone  5mg , and Prednisone  10mg ) Day 1: 2 tabs before breakfast, 1 tab after lunch, 1 tab after supper, and 2 tabs at bedtime. Day 2: 1 tab before breakfast, 1 tab after lunch, 1 tab after supper, and 2 tabs at bedtime. Day 3: 1 tab before breakfast, 1 tab after lunch, 1 tab after supper, and 1 tab at bedtime. Day 4: 1 tab before breakfast, 1 tab after lunch, and 1 tab at bedtime. Day 5: 1 tab before breakfast and 1 tab at bedtime. Day 6: 1 tab before breakfast.   Mucinex  D Max Strength [phone] MG Tb12  Generic drug: Pseudoephedrine-Guaifenesin  Take 1 tablet by mouth in the morning and at bedtime.   Mucinex  Fast-Max Severe Cold 5-10-200-325 MG/10ML Liqd Generic drug: Phenylephrine-DM-GG-APAP Take 5 mLs by mouth at bedtime as needed (cough).   Multivitamin Childrens Gummies Chew Chew 200 mcg by mouth daily.   rosuvastatin 10 MG tablet Commonly known as: CRESTOR Take 10 mg by mouth daily.   Trelegy Ellipta  200-62.5-25 MCG/INH Aepb Generic drug: Fluticasone-Umeclidin-Vilant Inhale 1 puff into the lungs daily.   Vitamin D 50 MCG (2000 UT) tablet Take 2,000 Units by mouth daily.        Allergies:  Allergies  Allergen Reactions   Enoxaparin Dermatitis and Itching   Levofloxacin Itching     Social History:  reports that he has been smoking cigarettes. He has never used smokeless tobacco. He reports current alcohol  use. He reports that he does not use drugs.   Physical Exam: BP (!) 147/64   Pulse 87   Constitutional:  Alert and oriented, No acute distress. HEENT: [place] AT, moist mucus membranes.  Trachea midline, no masses. Cardiovascular: No clubbing, cyanosis, or edema. Respiratory: Normal respiratory effort, no increased work of breathing. GI: Abdomen is soft, nontender, nondistended, no abdominal masses GU: Prostate 60+ cc, smooth without nodules or induration.  Skin: No rashes, bruises or suspicious lesions. Neurologic: Grossly intact, no focal deficits, moving all 4 extremities. Psychiatric: Normal mood and affect.   Assessment & Plan:    1. Elevated PSA Although PSA is a prostate cancer screening test he was informed that cancer is not the most common cause of an elevated PSA. Other potential causes including BPH and inflammation were discussed.  He was informed that the only way to adequately diagnose prostate cancer would be transrectal ultrasound and biopsy of the prostate. The procedure was discussed including potential risks of bleeding and infection/sepsis. He was also informed that a negative biopsy does not conclusively rule out the possibility that prostate cancer may be present and that continued monitoring is required.  The use of newer adjunctive blood and urine tests to predict the probability of high-grade prostate cancer were discussed. The use of multiparametric prostate MRI to evaluate for lesions suspicious for high grade prostate cancer and aid in targeted bx was reviewed.  Continued periodic surveillance was also discussed. I have initially recommended repeat PSA as this significant rise may be secondary to transient inflammation.  If PSA remains elevated on repeat, I recommend scheduling prostate MRI to assess for abnormalities suspicious for  high-grade prostate cancer.  I have reviewed the above documentation for accuracy and completeness, and I agree with the above.   Glendia JAYSON Barba, MD  [facility] [address] [place], [place] [address] [phone]

## 2023-12-18 ENCOUNTER — Other Ambulatory Visit: Payer: Self-pay

## 2023-12-18 ENCOUNTER — Ambulatory Visit: Payer: Self-pay | Admitting: Urology

## 2023-12-18 DIAGNOSIS — R972 Elevated prostate specific antigen [PSA]: Secondary | ICD-10-CM

## 2023-12-18 LAB — PSA: Prostate Specific Ag, Serum: 4.9 ng/mL — ABNORMAL HIGH (ref 0.0–4.0)

## 2023-12-22 ENCOUNTER — Encounter: Payer: Self-pay | Admitting: Urology

## 2023-12-25 DIAGNOSIS — Z9981 Dependence on supplemental oxygen: Secondary | ICD-10-CM | POA: Diagnosis not present

## 2023-12-25 DIAGNOSIS — J449 Chronic obstructive pulmonary disease, unspecified: Secondary | ICD-10-CM | POA: Diagnosis not present

## 2023-12-25 DIAGNOSIS — R052 Subacute cough: Secondary | ICD-10-CM | POA: Diagnosis not present

## 2023-12-25 DIAGNOSIS — R918 Other nonspecific abnormal finding of lung field: Secondary | ICD-10-CM | POA: Diagnosis not present

## 2023-12-25 DIAGNOSIS — F17218 Nicotine dependence, cigarettes, with other nicotine-induced disorders: Secondary | ICD-10-CM | POA: Diagnosis not present

## 2023-12-31 ENCOUNTER — Ambulatory Visit
Admission: RE | Admit: 2023-12-31 | Discharge: 2023-12-31 | Disposition: A | Discharge status: Home / Self Care | Source: Ambulatory Visit | Attending: Urology | Admitting: Urology

## 2023-12-31 DIAGNOSIS — R972 Elevated prostate specific antigen [PSA]: Secondary | ICD-10-CM | POA: Insufficient documentation

## 2023-12-31 DIAGNOSIS — K5730 Diverticulosis of large intestine without perforation or abscess without bleeding: Secondary | ICD-10-CM | POA: Diagnosis not present

## 2023-12-31 MED ORDER — GADOBUTROL 1 MMOL/ML IV SOLN
7.5000 mL | Freq: Once | INTRAVENOUS | Status: AC | PRN
  Administered 2023-12-31: 7.5 mL via INTRAVENOUS

## 2024-01-08 ENCOUNTER — Ambulatory Visit: Payer: Self-pay | Admitting: Urology

## 2024-03-11 ENCOUNTER — Other Ambulatory Visit: Payer: Self-pay | Admitting: Specialist

## 2024-03-11 DIAGNOSIS — R918 Other nonspecific abnormal finding of lung field: Secondary | ICD-10-CM

## 2024-03-21 ENCOUNTER — Ambulatory Visit
Admission: RE | Admit: 2024-03-21 | Discharge: 2024-03-21 | Disposition: A | Discharge status: Home / Self Care | Source: Ambulatory Visit | Attending: Specialist | Admitting: Specialist

## 2024-03-21 DIAGNOSIS — R918 Other nonspecific abnormal finding of lung field: Secondary | ICD-10-CM | POA: Diagnosis not present

## 2024-03-21 DIAGNOSIS — J432 Centrilobular emphysema: Secondary | ICD-10-CM | POA: Diagnosis not present

## 2024-04-21 DIAGNOSIS — F17210 Nicotine dependence, cigarettes, uncomplicated: Secondary | ICD-10-CM | POA: Diagnosis not present

## 2024-04-21 DIAGNOSIS — J449 Chronic obstructive pulmonary disease, unspecified: Secondary | ICD-10-CM | POA: Diagnosis not present

## 2024-04-21 DIAGNOSIS — Z9981 Dependence on supplemental oxygen: Secondary | ICD-10-CM | POA: Diagnosis not present

## 2024-04-21 DIAGNOSIS — R911 Solitary pulmonary nodule: Secondary | ICD-10-CM | POA: Diagnosis not present

## 2025-01-10 ENCOUNTER — Other Ambulatory Visit

## 2025-01-13 ENCOUNTER — Ambulatory Visit: Admitting: Urology
